# Patient Record
Sex: Male | Born: 1995 | Race: Black or African American | Hispanic: No | Marital: Single | Smoking: Current every day smoker
Health system: Southern US, Community
[De-identification: ages and names within clinical notes are randomized; demographics above are authoritative.]

## PROBLEM LIST (undated history)

## (undated) ENCOUNTER — Emergency Department (HOSPITAL_COMMUNITY): Discharge status: Absent without leave | Payer: Self-pay

## (undated) DIAGNOSIS — I10 Essential (primary) hypertension: Secondary | ICD-10-CM

## (undated) HISTORY — PX: WISDOM TOOTH EXTRACTION: SHX21

## (undated) HISTORY — PX: CERVICAL FUSION: SHX112

---

## 2015-11-24 ENCOUNTER — Emergency Department (HOSPITAL_COMMUNITY)
Admission: EM | Admit: 2015-11-24 | Discharge: 2015-11-24 | Disposition: A | Discharge status: Home / Self Care | Payer: Self-pay | Attending: Emergency Medicine | Admitting: Emergency Medicine

## 2015-11-24 ENCOUNTER — Emergency Department (HOSPITAL_COMMUNITY): Payer: Self-pay

## 2015-11-24 ENCOUNTER — Encounter (HOSPITAL_COMMUNITY): Payer: Self-pay | Admitting: Emergency Medicine

## 2015-11-24 DIAGNOSIS — X500XXA Overexertion from strenuous movement or load, initial encounter: Secondary | ICD-10-CM | POA: Insufficient documentation

## 2015-11-24 DIAGNOSIS — Y9389 Activity, other specified: Secondary | ICD-10-CM | POA: Insufficient documentation

## 2015-11-24 DIAGNOSIS — S46912A Strain of unspecified muscle, fascia and tendon at shoulder and upper arm level, left arm, initial encounter: Secondary | ICD-10-CM | POA: Insufficient documentation

## 2015-11-24 DIAGNOSIS — Y92009 Unspecified place in unspecified non-institutional (private) residence as the place of occurrence of the external cause: Secondary | ICD-10-CM | POA: Insufficient documentation

## 2015-11-24 DIAGNOSIS — Y999 Unspecified external cause status: Secondary | ICD-10-CM | POA: Insufficient documentation

## 2015-11-24 MED ORDER — NAPROXEN 500 MG PO TABS: 500.0000 mg | ORAL_TABLET | Freq: Two times a day (BID) | ORAL | 0 refills | Status: DC

## 2015-11-24 NOTE — ED Triage Notes (Signed)
Pt c/o left shoulder pain for a couple of days. Pt denies any injury.

## 2015-11-24 NOTE — ED Provider Notes (Signed)
[facility]-EMERGENCY DEPT Provider Note   CSN: [id number] Arrival date & time: 11/24/15  1913     History   Chief Complaint Chief Complaint  Patient presents with  . Shoulder Pain    HPI Tyler Solomon is a 20 y.o. male presenting with left anterior shoulder pain which has been persistent for past week and is worsened with certain movements, esp attempts to flex the shoulder beyond 90 degrees.  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  He endorses having an AC injury while in high school.  He denies weakness or numbness in his upper extremities and denies cp, sob, fevers, cough.  He took ibuprofen 400 mg x 1 this week without relief of pain.  The history is provided by the patient.    History reviewed. No pertinent past medical history.  There are no active problems to display for this patient.   Past Surgical History:  Procedure Laterality Date  . CERVICAL FUSION    . WISDOM TOOTH EXTRACTION         Home Medications    Prior to Admission medications   Medication Sig Start Date End Date Taking? Authorizing Provider  naproxen (NAPROSYN) 500 MG tablet Take 1 tablet (500 mg total) by mouth 2 (two) times daily. 11/24/15   Burgess AmorJulie Candance Bohlman, PA-C    Family History History reviewed. No pertinent family history.  Social History Social History  Substance Use Topics  . Smoking status: Never Smoker  . Smokeless tobacco: Never Used  . Alcohol use Yes     Allergies   Review of patient's allergies indicates no known allergies.   Review of Systems Review of Systems  Constitutional: Negative for fever.  Respiratory: Negative for shortness of breath.   Cardiovascular: Negative for chest pain.  Musculoskeletal: Positive for arthralgias and joint swelling. Negative for myalgias.  Neurological: Negative for weakness and numbness.      Physical Exam Updated Vital Signs BP 146/70 (BP Location: Left Arm)   Pulse 71   Temp 98.1 F (36.7 C) (Oral)   Resp 16   Ht 6' (1.829 m)   Wt 94.8 kg   SpO2 100%   BMI 28.35 kg/m   Physical Exam  Constitutional: He appears well-developed and well-nourished.  HENT:  Head: Atraumatic.  Neck: Normal range of motion.  Cardiovascular:  Pulses equal bilaterally  Musculoskeletal: He exhibits tenderness. He exhibits no edema or deformity.       Left shoulder: He exhibits tenderness. He exhibits no bony tenderness, no swelling, no effusion, no crepitus, no deformity, no spasm and normal strength.  ttp anterior deltoid (left) without palpable deformity or edema.    Neurological: He is alert. He has normal strength. He displays normal reflexes. No sensory deficit.  Skin: Skin is warm and dry.  Psychiatric: He has a normal mood and affect.     ED Treatments / Results  Labs (all labs ordered are listed, but only abnormal results are displayed) Labs Reviewed - No data to display  EKG  EKG Interpretation None       Radiology Dg Shoulder Left  Result Date: 11/24/2015 CLINICAL DATA:  Left shoulder pain after ovary use. History of AC injury. Pain for 1-1/2 weeks. EXAM: LEFT SHOULDER - 2+ VIEW COMPARISON:  None. FINDINGS: There is no evidence of fracture or dislocation. There is no evidence of  arthropathy or other focal bone abnormality. The acromioclavicular joint is congruent. Soft tissues are unremarkable. IMPRESSION: Negative radiographs of the left shoulder. Acromioclavicular joint is congruent. Electronically Signed   By: Rubye Oaks M.D.   On: 11/24/2015 21:10    Procedures Procedures (including critical care time)  Medications Ordered in ED Medications - No data to display   Initial Impression / Assessment and Plan / ED Course  I have reviewed the triage vital signs and the nursing notes.  Pertinent labs & imaging results that were available during my care  of the patient were reviewed by me and considered in my medical decision making (see chart for details).  Clinical Course    Imaging reviewed and negative.  Probable deltoid strain, discussed he could have a rotator injury, but will need f/u with ortho if sx persist after tx.  Referral given.  Ice,  Rest,  Naproxen.    Final Clinical Impressions(s) / ED Diagnoses   Final diagnoses:  Left shoulder strain, initial encounter    New Prescriptions New Prescriptions   NAPROXEN (NAPROSYN) 500 MG TABLET    Take 1 tablet (500 mg total) by mouth 2 (two) times daily.     Burgess Amor, PA-C 11/24/15 2128    Blane Ohara, MD 11/25/15 [phone]

## 2017-05-19 IMAGING — DX DG SHOULDER 2+V*L*
3 series · 3 of 3 positions shown · non-contrast
Comparison: None.

CLINICAL DATA: Left shoulder pain after ovary use. History of AC
injury. Pain for 1-1/2 weeks.

EXAM:
LEFT SHOULDER - 2+ VIEW

[shoulder grashey]
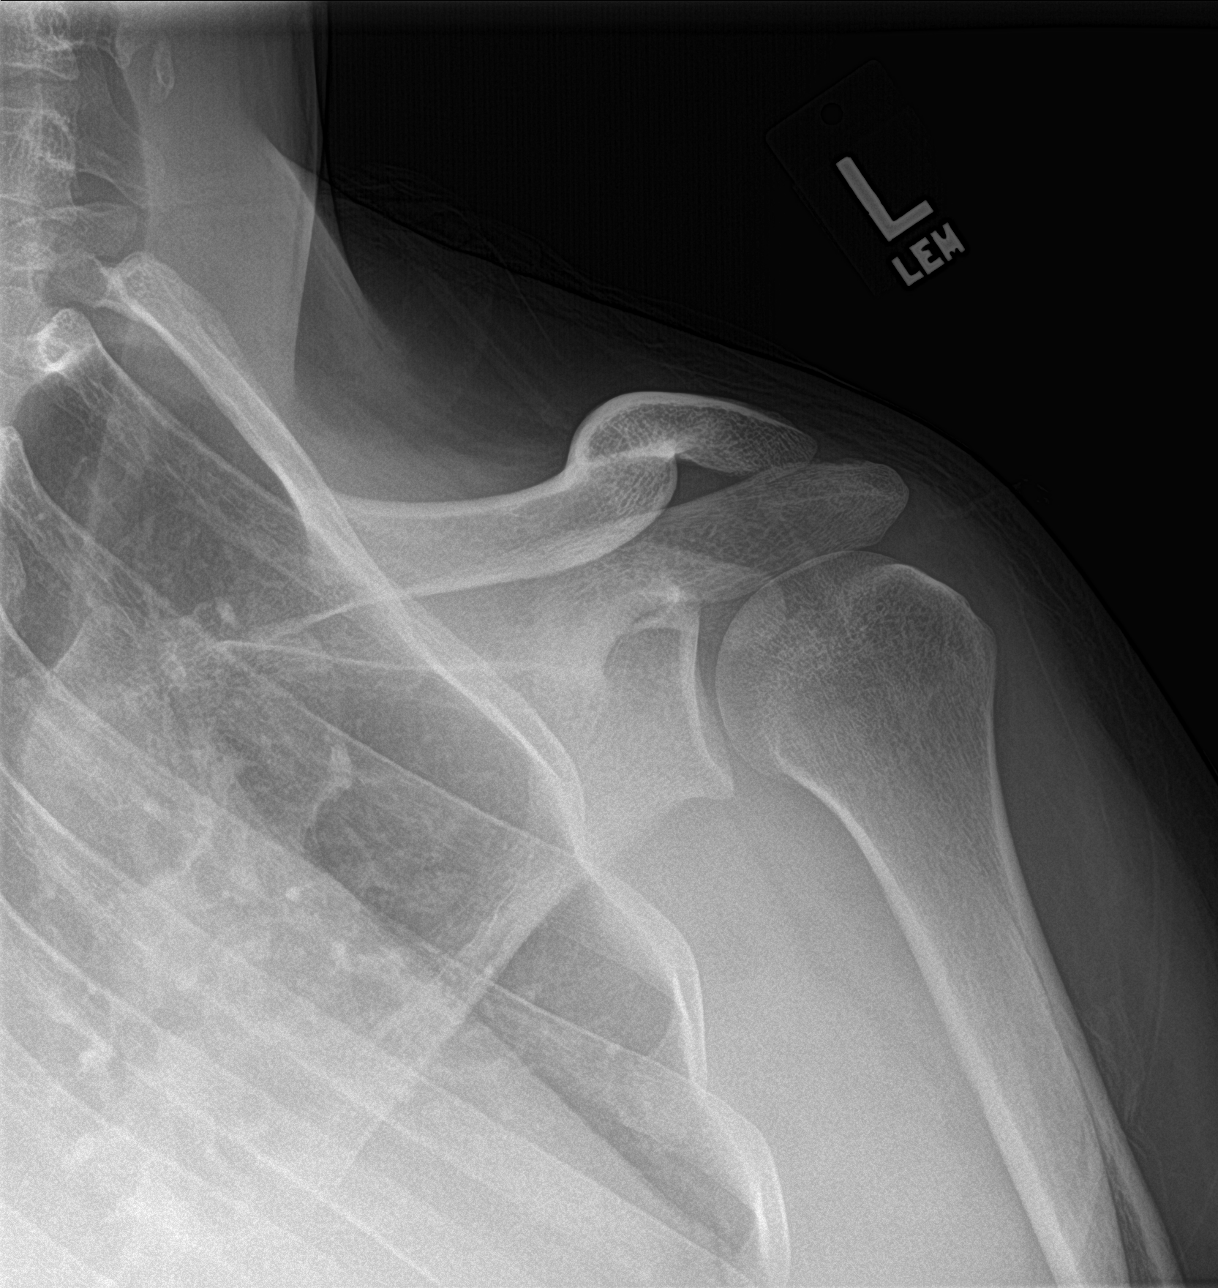

[shoulder y view]
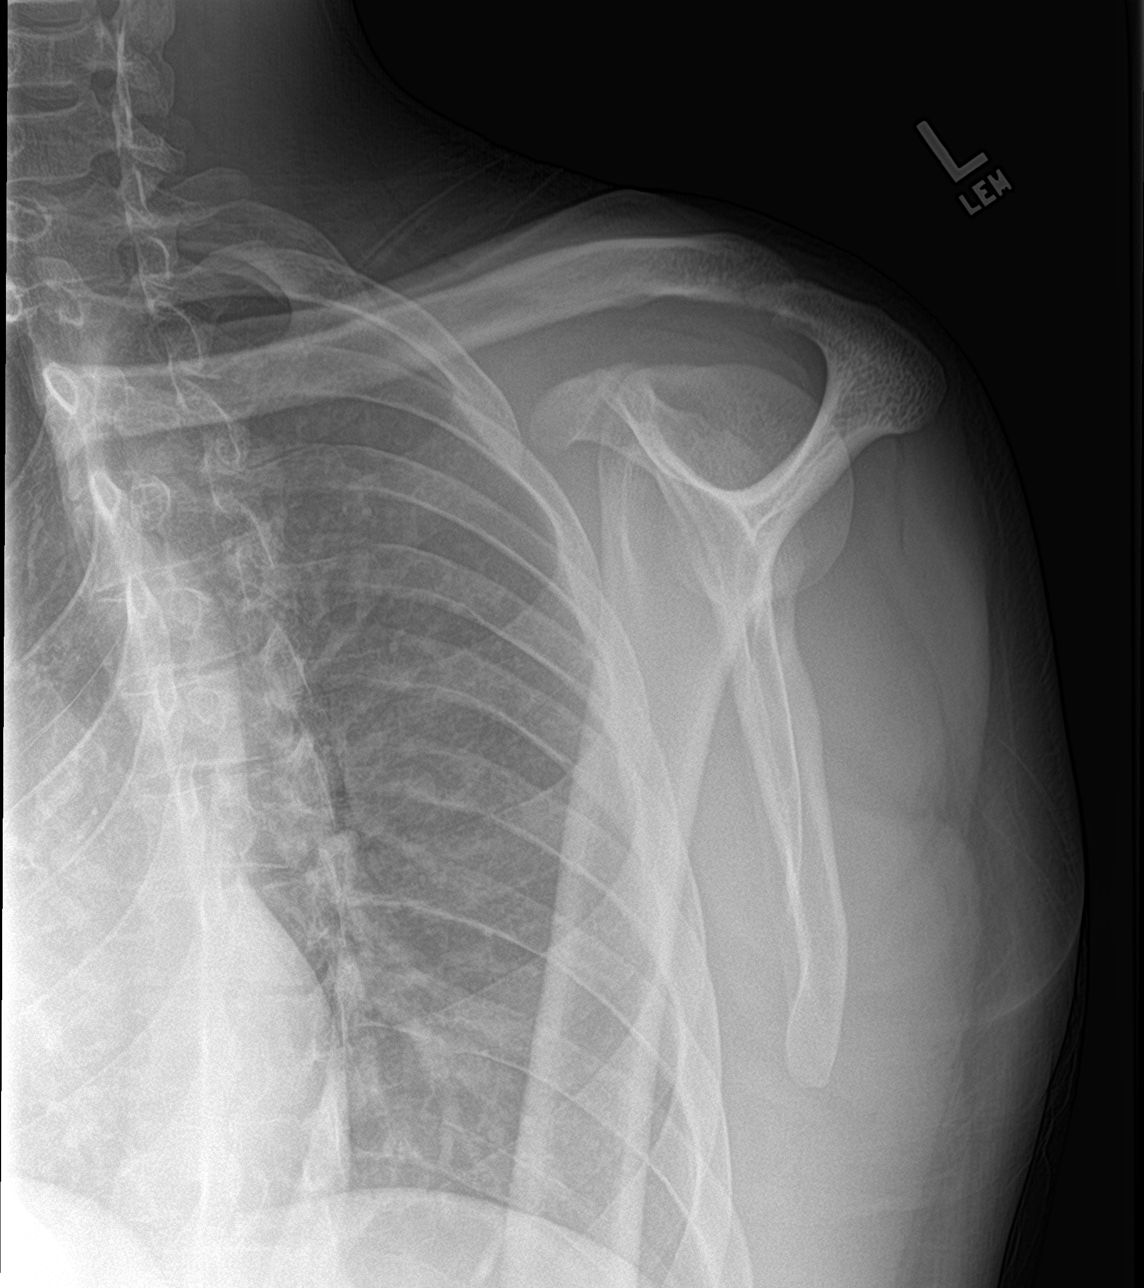

[shoulder axillary]
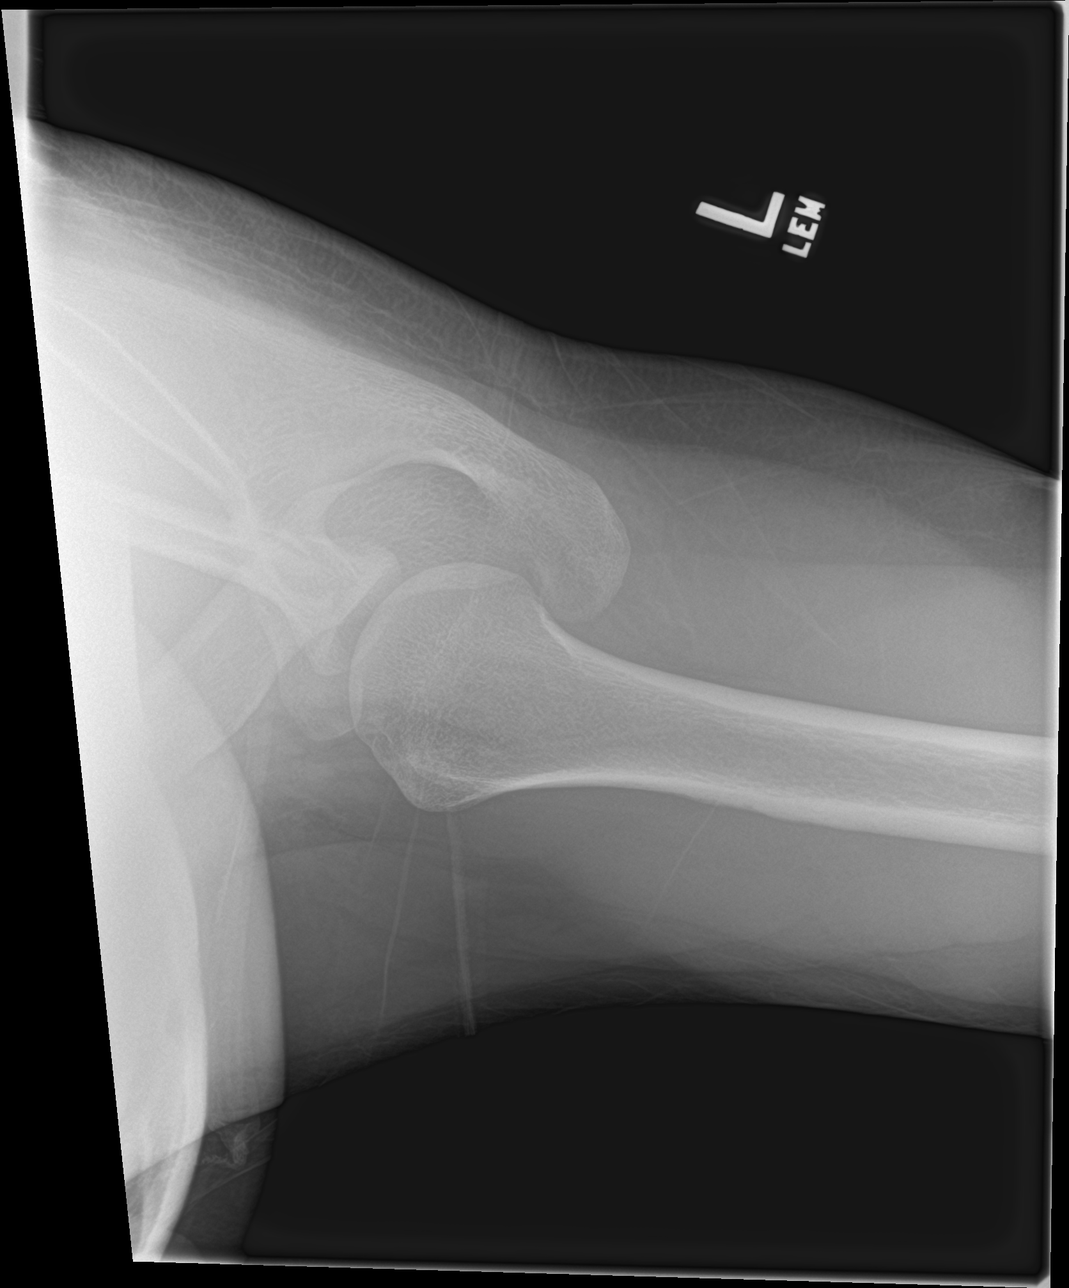

[3 of 3 positions shown; findings below may reference images not displayed]

FINDINGS: There is no evidence of fracture or dislocation. There is no
evidence of arthropathy or other focal bone abnormality. The
acromioclavicular joint is congruent. Soft tissues are unremarkable.
IMPRESSION: Negative radiographs of the left shoulder. Acromioclavicular joint
is congruent.

## 2017-09-18 ENCOUNTER — Other Ambulatory Visit: Payer: Self-pay

## 2017-09-18 ENCOUNTER — Emergency Department (HOSPITAL_COMMUNITY)
Admission: EM | Admit: 2017-09-18 | Discharge: 2017-09-19 | Disposition: A | Discharge status: Home / Self Care | Payer: 59 | Attending: Emergency Medicine | Admitting: Emergency Medicine

## 2017-09-18 ENCOUNTER — Encounter (HOSPITAL_COMMUNITY): Payer: Self-pay

## 2017-09-18 DIAGNOSIS — W268XXA Contact with other sharp object(s), not elsewhere classified, initial encounter: Secondary | ICD-10-CM | POA: Insufficient documentation

## 2017-09-18 DIAGNOSIS — Y93E5 Activity, floor mopping and cleaning: Secondary | ICD-10-CM | POA: Insufficient documentation

## 2017-09-18 DIAGNOSIS — Z79899 Other long term (current) drug therapy: Secondary | ICD-10-CM | POA: Insufficient documentation

## 2017-09-18 DIAGNOSIS — Y999 Unspecified external cause status: Secondary | ICD-10-CM | POA: Insufficient documentation

## 2017-09-18 DIAGNOSIS — S61411A Laceration without foreign body of right hand, initial encounter: Secondary | ICD-10-CM | POA: Insufficient documentation

## 2017-09-18 DIAGNOSIS — I10 Essential (primary) hypertension: Secondary | ICD-10-CM | POA: Diagnosis not present

## 2017-09-18 DIAGNOSIS — Y929 Unspecified place or not applicable: Secondary | ICD-10-CM | POA: Diagnosis not present

## 2017-09-18 HISTORY — DX: Essential (primary) hypertension: I10

## 2017-09-18 NOTE — ED Provider Notes (Signed)
[facility] EMERGENCY DEPARTMENT Provider Note   CSN: [id number] Arrival date & time: 09/18/17  2017     History   Chief Complaint Chief Complaint  Patient presents with  . Extremity Laceration    right palm    HPI Tyler Solomon is a 22 y.o. male presenting with laceration to the volar surface of his right hand which occurred around 9 AM this morning.  He was sweeping using a plastic handled broom when the handle snapped cutting his hand with a sharp edge.  He has cleaned the wound, applied multiple attempts at bandaging, but with movement and normal activity the wound continues to open and bleed.  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  He denies significant pain at the site.  He has no numbness or pain radiating into his wrist or his fingers.  His tetanus is current.  The history is provided by the patient.    Past Medical History:  Diagnosis Date  . Hypertension     There are no active problems to display for this patient.   Past Surgical History:  Procedure Laterality Date  . CERVICAL FUSION    . WISDOM TOOTH EXTRACTION          Home Medications    Prior to Admission medications   Medication Sig Start Date End Date Taking? Authorizing Provider  naproxen (NAPROSYN) 500 MG tablet Take 1 tablet (500 mg total) by mouth 2 (two) times daily. 11/24/15   Burgess Amor, PA-C    Family History No family history on file.  Social History Social History   Tobacco Use  . Smoking status: Never Smoker  . Smokeless tobacco: Never Used  Substance Use Topics  . Alcohol use: Yes  . Drug use: No     Allergies   Patient has no known allergies.   Review of Systems Review of Systems  Constitutional: Negative for chills and fever.  Respiratory: Negative.   Cardiovascular: Negative.   Gastrointestinal: Negative.   Musculoskeletal: Negative for arthralgias.  Skin: Positive for wound.  Neurological: Negative  for weakness and numbness.     Physical Exam Updated Vital Signs BP (!) 157/86   Pulse 77   Temp 98.1 F (36.7 C) (Oral)   Resp 18   Ht 6' (1.829 m)   Wt 94.3 kg (208 lb)   SpO2 99%   BMI 28.21 kg/m   Physical Exam  Constitutional: He is oriented to person, place, and time. He appears well-developed and well-nourished.  HENT:  Head: Normocephalic.  Cardiovascular: Normal rate.  Pulmonary/Chest: Effort normal.  Musculoskeletal: He exhibits no tenderness.  Neurological: He is alert and oriented to person, place, and time. No sensory deficit.  Skin: Laceration noted.  1 cm curvilinear superficial laceration which appears fairly approximated on his volar right hand.  Hemostatic.     ED Treatments / Results  Labs (all labs ordered are listed, but only abnormal results are displayed) Labs Reviewed - No data to display  EKG None  Radiology No results found.  Procedures Procedures (including critical care time)  LACERATION REPAIR Performed by: Burgess Amor Authorized by: Burgess Amor Consent: Verbal consent obtained. Risks and benefits: risks, benefits and alternatives were discussed Consent given by: patient Patient identity confirmed: provided demographic data Prepped and Draped in normal sterile fashion Wound explored  Laceration Location: right hand  Laceration Length: 1cm  No Foreign Bodies seen or palpated  Anesthesia: None  Local  anesthetic: N/A  Anesthetic total: NA Irrigation method: Safe cleanse followed by scrubbing with 4 x 4's Amount of cleaning: Wound was aggressively cleaned.  Skin closure: Dermabond  Number of sutures: N/A  Technique: Dermabond  Patient tolerance: Patient tolerated the procedure well with no immediate complications.   Medications Ordered in ED Medications - No data to display   Initial Impression / Assessment and Plan / ED Course  I have reviewed the triage vital signs and the nursing notes.  Pertinent labs &  imaging results that were available during my care of the patient were reviewed by me and considered in my medical decision making (see chart for details).     Discussed the risks and benefits of Dermabond including possible risk of infection given his wound is older than 6 hours.  Patient insists that with his job he must have closure that completely seals the wound.  I advised he get rechecked immediately for any increased redness, pain, swelling or drainage of pus.  PRN follow-up anticipated.  Final Clinical Impressions(s) / ED Diagnoses   Final diagnoses:  Laceration of right hand without foreign body, initial encounter    ED Discharge Orders    None       Victoriano Laindol, Jadden Yim, PA-C 09/19/17 0043    Bethann BerkshireZammit, Joseph, MD 09/20/17 [phone]

## 2017-09-18 NOTE — ED Triage Notes (Signed)
Pt reports cutting hand on broom handle last night. Skin flap has already started to reconnect. No bleeding, superficial.

## 2017-09-18 NOTE — Discharge Instructions (Signed)
As discussed,  get rechecked for any signs of infection including redness, swelling, increased pain or drainage of pus.

## 2019-03-08 ENCOUNTER — Other Ambulatory Visit: Payer: Self-pay

## 2019-03-08 ENCOUNTER — Ambulatory Visit
Admission: EM | Admit: 2019-03-08 | Discharge: 2019-03-08 | Disposition: A | Discharge status: Home / Self Care | Payer: 59 | Attending: Emergency Medicine | Admitting: Emergency Medicine

## 2019-03-08 NOTE — ED Triage Notes (Signed)
Pt presents to UC stating he needs a covid test for work. Pt denies any exposures or symptoms.

## 2019-03-09 LAB — NOVEL CORONAVIRUS, NAA: SARS-CoV-2, NAA: NOT DETECTED

## 2019-04-15 ENCOUNTER — Other Ambulatory Visit: Payer: Self-pay

## 2019-04-15 ENCOUNTER — Ambulatory Visit
Admission: EM | Admit: 2019-04-15 | Discharge: 2019-04-15 | Disposition: A | Discharge status: Home / Self Care | Payer: 59 | Attending: Emergency Medicine | Admitting: Emergency Medicine

## 2019-04-15 DIAGNOSIS — Z20822 Contact with and (suspected) exposure to covid-19: Secondary | ICD-10-CM

## 2019-04-15 NOTE — ED Triage Notes (Signed)
Pt presents to UC for covid test for employer. Pt denies symptoms. Pt had secondary exposure.

## 2019-04-15 NOTE — ED Provider Notes (Signed)
[place]   [id number] 04/15/19 Arrival Time: 1826   CC: COVID test  SUBJECTIVE: History from: patient.  Tyler Solomon is a 24 y.o. male who presents for COVID testing.  Complains of secondary exposure.  Mother had positive exposure.  Mother tested negative for COVID.  Denies recent travel.  Denies aggravating or alleviating symptoms.  Denies previous COVID infection.   Denies fever, chills, fatigue, nasal congestion, rhinorrhea, sore throat, cough, SOB, wheezing, chest pain, nausea, vomiting, changes in bowel or bladder habits.    ROS: As per HPI.  All other pertinent ROS negative.     Past Medical History:  Diagnosis Date  . Hypertension    Past Surgical History:  Procedure Laterality Date  . CERVICAL FUSION    . WISDOM TOOTH EXTRACTION     No Known Allergies No current facility-administered medications on file prior to encounter.   Current Outpatient Medications on File Prior to Encounter  Medication Sig Dispense Refill  . naproxen (NAPROSYN) 500 MG tablet Take 1 tablet (500 mg total) by mouth 2 (two) times daily. 30 tablet 0   Social History   Socioeconomic History  . Marital status: Single    Spouse name: Not on file  . Number of children: Not on file  . Years of education: Not on file  . Highest education level: Not on file  Occupational History  . Not on file  Tobacco Use  . Smoking status: Never Smoker  . Smokeless tobacco: Never Used  Substance and Sexual Activity  . Alcohol use: Yes  . Drug use: No  . Sexual activity: Not on file  Other Topics Concern  . Not on file  Social History Narrative  . Not on file   Social Determinants of Health   Financial Resource Strain:   . Difficulty of Paying Living Expenses: Not on file  Food Insecurity:   . Worried About Charity fundraiser in the Last Year: Not on file  . Ran Out of Food in the Last Year: Not on file  Transportation Needs:   . Lack of Transportation (Medical): Not on file  . Lack  of Transportation (Non-Medical): Not on file  Physical Activity:   . Days of Exercise per Week: Not on file  . Minutes of Exercise per Session: Not on file  Stress:   . Feeling of Stress : Not on file  Social Connections:   . Frequency of Communication with Friends and Family: Not on file  . Frequency of Social Gatherings with Friends and Family: Not on file  . Attends Religious Services: Not on file  . Active Member of Clubs or Organizations: Not on file  . Attends Archivist Meetings: Not on file  . Marital Status: Not on file  Intimate Partner Violence:   . Fear of Current or Ex-Partner: Not on file  . Emotionally Abused: Not on file  . Physically Abused: Not on file  . Sexually Abused: Not on file   Family History  Problem Relation Age of Onset  . Healthy Mother   . Healthy Father     OBJECTIVE:  Vitals:   04/15/19 1839  BP: 121/80  Pulse: 81  Resp: 16  Temp: 98.5 F (36.9 C)  TempSrc: Oral  SpO2: 97%     General appearance: alert; well-appearing, nontoxic; speaking in full sentences and tolerating own secretions HEENT: NCAT; Ears: EACs clear, TMs pearly gray; Eyes: PERRL.  EOM grossly intact.Nose: nares patent without rhinorrhea, Throat: oropharynx clear, tonsils  non erythematous or enlarged, uvula midline  Neck: supple without LAD Lungs: unlabored respirations, symmetrical air entry; cough: absent; no respiratory distress; CTAB Heart: regular rate and rhythm.  Skin: warm and dry Psychological: alert and cooperative; normal mood and affect  ASSESSMENT & PLAN:  1. Suspected COVID-19 virus infection   2. Encounter for laboratory testing for COVID-19 virus    COVID testing ordered.  It will take between 5-7 days for test results.  Someone will contact you regarding abnormal results.    In the meantime: You should remain isolated in your home for 10 days from symptom onset AND greater than 72 hours after symptoms resolution (absence of fever without  the use of fever-reducing medication and improvement in respiratory symptoms), whichever is longer OR 14 days from exposure Get plenty of rest and push fluids Use OTC zyrtec for nasal congestion, runny nose, and/or sore throat Use OTC flonase for nasal congestion and runny nose Use medications daily for symptom relief Use OTC medications like ibuprofen or tylenol as needed fever or pain Call or go to the ED if you have any new or worsening symptoms such as fever, cough, shortness of breath, chest tightness, chest pain, turning blue, changes in mental status, etc...   Reviewed expectations re: course of current medical issues. Questions answered. Outlined signs and symptoms indicating need for more acute intervention. Patient verbalized understanding. After Visit Summary given.         Rennis Harding, PA-C 04/15/19 1851

## 2019-04-15 NOTE — Discharge Instructions (Addendum)

## 2019-04-17 LAB — NOVEL CORONAVIRUS, NAA: SARS-CoV-2, NAA: NOT DETECTED

## 2019-06-23 ENCOUNTER — Other Ambulatory Visit: Payer: Self-pay

## 2019-06-23 ENCOUNTER — Encounter: Payer: Self-pay | Admitting: Emergency Medicine

## 2019-06-23 ENCOUNTER — Ambulatory Visit
Admission: EM | Admit: 2019-06-23 | Discharge: 2019-06-23 | Disposition: A | Discharge status: Home / Self Care | Payer: 59 | Attending: Emergency Medicine | Admitting: Emergency Medicine

## 2019-06-23 DIAGNOSIS — Z0189 Encounter for other specified special examinations: Secondary | ICD-10-CM

## 2019-06-23 DIAGNOSIS — Z20822 Contact with and (suspected) exposure to covid-19: Secondary | ICD-10-CM

## 2019-06-23 NOTE — ED Provider Notes (Signed)
[place]   [id number] 06/23/19 Arrival Time: 1824   CC: COVID exposure  SUBJECTIVE: History from: patient.  Tyler Solomon is a 24 y.o. male who presents for COVID testing.  Complains of sick exposure 3 days ago to coworker.  Denies aggravating or alleviating symptoms.  Denies previous COVID infection.   Denies fever, chills, fatigue, nasal congestion, rhinorrhea, sore throat, cough, SOB, wheezing, chest pain, nausea, vomiting, changes in bowel or bladder habits.    ROS: As per HPI.  All other pertinent ROS negative.     Past Medical History:  Diagnosis Date  . Hypertension    Past Surgical History:  Procedure Laterality Date  . CERVICAL FUSION    . WISDOM TOOTH EXTRACTION     No Known Allergies No current facility-administered medications on file prior to encounter.   No current outpatient medications on file prior to encounter.   Social History   Socioeconomic History  . Marital status: Single    Spouse name: Not on file  . Number of children: Not on file  . Years of education: Not on file  . Highest education level: Not on file  Occupational History  . Not on file  Tobacco Use  . Smoking status: Never Smoker  . Smokeless tobacco: Never Used  Substance and Sexual Activity  . Alcohol use: Yes  . Drug use: No  . Sexual activity: Not on file  Other Topics Concern  . Not on file  Social History Narrative  . Not on file   Social Determinants of Health   Financial Resource Strain:   . Difficulty of Paying Living Expenses:   Food Insecurity:   . Worried About Charity fundraiser in the Last Year:   . Arboriculturist in the Last Year:   Transportation Needs:   . Film/video editor (Medical):   Marland Kitchen Lack of Transportation (Non-Medical):   Physical Activity:   . Days of Exercise per Week:   . Minutes of Exercise per Session:   Stress:   . Feeling of Stress :   Social Connections:   . Frequency of Communication with Friends and Family:   .  Frequency of Social Gatherings with Friends and Family:   . Attends Religious Services:   . Active Member of Clubs or Organizations:   . Attends Archivist Meetings:   Marland Kitchen Marital Status:   Intimate Partner Violence:   . Fear of Current or Ex-Partner:   . Emotionally Abused:   Marland Kitchen Physically Abused:   . Sexually Abused:    Family History  Problem Relation Age of Onset  . Healthy Mother   . Healthy Father     OBJECTIVE:  Vitals:   06/23/19 1831  BP: (!) 147/85  Pulse: 85  Resp: 16  Temp: 99 F (37.2 C)  TempSrc: Oral  SpO2: 98%    General appearance: alert; well-appearing, nontoxic; speaking in full sentences and tolerating own secretions HEENT: NCAT; Ears: EACs clear; Eyes: PERRL.  EOM grossly intact.Nose: nares patent without rhinorrhea, Throat: oropharynx clear, tonsils non erythematous or enlarged, uvula midline  Neck: supple without LAD Lungs: unlabored respirations, symmetrical air entry; cough: absent; no respiratory distress; CTAB Heart: regular rate and rhythm.  Skin: warm and dry Psychological: alert and cooperative; normal mood and affect  ASSESSMENT & PLAN:  1. Exposure to COVID-19 virus   2. Patient request for diagnostic testing     COVID testing ordered.  It will take between 2-5 days for test  results.  Someone will contact you regarding abnormal results.    In the meantime:  If you were to develop symptoms: You should remain isolated in your home for 10 days from symptom onset AND greater than 72 hours after symptoms resolution (absence of fever without the use of fever-reducing medication and improvement in respiratory symptoms), whichever is longer If you have had exposure: you should remain in quarantine for 7 days from exposure.  However, if symptoms develop you must self isolate and return for retesting Get plenty of rest and push fluids Follow up with PCP as needed Call or go to the ED if you have any new symptoms such as fever, cough,  shortness of breath, chest tightness, chest pain, turning blue, changes in mental status, etc...   Reviewed expectations re: course of current medical issues. Questions answered. Outlined signs and symptoms indicating need for more acute intervention. Patient verbalized understanding. After Visit Summary given.         Rennis Harding, PA-C 06/23/19 1837

## 2019-06-23 NOTE — Discharge Instructions (Signed)

## 2019-06-23 NOTE — ED Triage Notes (Signed)
Pt presents with complaints of needing covid test after exposure on Sunday. Denies any symptoms. Intake completed by [place] PA.

## 2019-06-24 LAB — NOVEL CORONAVIRUS, NAA: SARS-CoV-2, NAA: NOT DETECTED

## 2019-06-24 LAB — SARS-COV-2, NAA 2 DAY TAT

## 2019-11-21 ENCOUNTER — Ambulatory Visit
Admission: EM | Admit: 2019-11-21 | Discharge: 2019-11-21 | Disposition: A | Discharge status: Home / Self Care | Payer: 59 | Attending: Family Medicine | Admitting: Family Medicine

## 2019-11-21 ENCOUNTER — Other Ambulatory Visit: Payer: Self-pay

## 2019-11-21 DIAGNOSIS — Z1152 Encounter for screening for COVID-19: Secondary | ICD-10-CM

## 2019-11-21 NOTE — ED Triage Notes (Signed)
Pt here for covid test , no symptoms  

## 2019-11-22 LAB — SARS-COV-2, NAA 2 DAY TAT

## 2019-11-22 LAB — NOVEL CORONAVIRUS, NAA: SARS-CoV-2, NAA: NOT DETECTED

## 2020-01-06 ENCOUNTER — Ambulatory Visit
Admission: RE | Admit: 2020-01-06 | Discharge: 2020-01-06 | Disposition: A | Discharge status: Home / Self Care | Payer: 59 | Source: Ambulatory Visit | Attending: Emergency Medicine | Admitting: Emergency Medicine

## 2020-01-06 ENCOUNTER — Other Ambulatory Visit: Payer: Self-pay

## 2020-01-06 NOTE — ED Triage Notes (Signed)
Pt presents for covid test. Denies symptoms.

## 2020-01-08 LAB — SARS-COV-2, NAA 2 DAY TAT

## 2020-01-08 LAB — NOVEL CORONAVIRUS, NAA: SARS-CoV-2, NAA: NOT DETECTED

## 2020-03-28 ENCOUNTER — Other Ambulatory Visit: Payer: 59

## 2020-04-19 ENCOUNTER — Encounter (HOSPITAL_COMMUNITY): Payer: Self-pay

## 2020-04-19 ENCOUNTER — Other Ambulatory Visit: Payer: Self-pay

## 2020-04-19 ENCOUNTER — Emergency Department (HOSPITAL_COMMUNITY)
Admission: EM | Admit: 2020-04-19 | Discharge: 2020-04-19 | Disposition: A | Discharge status: Home / Self Care | Payer: BC Managed Care – PPO | Attending: Emergency Medicine | Admitting: Emergency Medicine

## 2020-04-19 DIAGNOSIS — W57XXXA Bitten or stung by nonvenomous insect and other nonvenomous arthropods, initial encounter: Secondary | ICD-10-CM | POA: Insufficient documentation

## 2020-04-19 DIAGNOSIS — I10 Essential (primary) hypertension: Secondary | ICD-10-CM | POA: Insufficient documentation

## 2020-04-19 DIAGNOSIS — S60561A Insect bite (nonvenomous) of right hand, initial encounter: Secondary | ICD-10-CM | POA: Diagnosis present

## 2020-04-19 DIAGNOSIS — S0086XA Insect bite (nonvenomous) of other part of head, initial encounter: Secondary | ICD-10-CM | POA: Insufficient documentation

## 2020-04-19 DIAGNOSIS — F17290 Nicotine dependence, other tobacco product, uncomplicated: Secondary | ICD-10-CM | POA: Insufficient documentation

## 2020-04-19 DIAGNOSIS — S30860A Insect bite (nonvenomous) of lower back and pelvis, initial encounter: Secondary | ICD-10-CM | POA: Diagnosis not present

## 2020-04-19 NOTE — Discharge Instructions (Addendum)
Please keep the area clean and dry.  You may use topical anti-itch cream or antibiotic cream to the areas as well as Benadryl if needed.  Please make sure to return to the ER if you develop fevers, have increasing warmth, redness, inability to move your joints, throat closing, shortness of breath.

## 2020-04-19 NOTE — ED Triage Notes (Signed)
Pt reports he believes he was bite by a spider on his right hand approx 1 week ago. The area was itching, now he has a red area to right forehead and lower back

## 2020-04-19 NOTE — ED Provider Notes (Signed)
[facility] EMERGENCY DEPARTMENT Provider Note   CSN: [id number] Arrival date & time: 04/19/20  3154     History Chief Complaint  Patient presents with  . Insect Bite    Tyler Solomon is a 25 y.o. male.  HPI 25 year old male with history of hypertension presents to the ER with complaints of multiple insect bites.  States he noticed a bite to the right dorsal lateral side of his right hand approximately a week ago.  States it began to itch several days ago.  He has been taking Benadryl as of this morning.  He also noticed a bug bite to his right forehead and left low back.  No known fevers or chills.  No drainage, warmth, swelling.  No throat closing, shortness of breath, rashes developing.  He was concerned that he may need antibiotics.    Past Medical History:  Diagnosis Date  . Hypertension     There are no problems to display for this patient.   Past Surgical History:  Procedure Laterality Date  . CERVICAL FUSION    . WISDOM TOOTH EXTRACTION         Family History  Problem Relation Age of Onset  . Healthy Mother   . Healthy Father     Social History   Tobacco Use  . Smoking status: Current Every Day Smoker    Types: Cigars  . Smokeless tobacco: Never Used  Substance Use Topics  . Alcohol use: Yes  . Drug use: No    Home Medications Prior to Admission medications   Not on File    Allergies    Patient has no known allergies.  Review of Systems   Review of Systems  Constitutional: Negative for fever.  Skin: Positive for color change and wound.    Physical Exam Updated Vital Signs BP (!) 164/93 (BP Location: Right Arm)   Pulse 66   Temp 98.1 F (36.7 C) (Oral)   Resp 20   Ht 6' (1.829 m)   Wt 106.6 kg   SpO2 100%   BMI 31.87 kg/m   Physical Exam Vitals and nursing note reviewed.  Constitutional:      General: He is not in acute distress.    Appearance: He is well-developed and well-nourished. He is obese. He is not ill-appearing or  diaphoretic.  HENT:     Head: Normocephalic and atraumatic.  Eyes:     Conjunctiva/sclera: Conjunctivae normal.  Cardiovascular:     Rate and Rhythm: Normal rate and regular rhythm.     Heart sounds: No murmur heard.   Pulmonary:     Effort: Pulmonary effort is normal. No respiratory distress.     Breath sounds: Normal breath sounds.  Abdominal:     Palpations: Abdomen is soft.     Tenderness: There is no abdominal tenderness.  Musculoskeletal:        General: No edema. Normal range of motion.     Cervical back: Neck supple.  Skin:    General: Skin is warm and dry.     Findings: Lesion present. No erythema.     Comments: Evidence of a dime size indurated area to the dorsal aspect of the lateral right hand.  No warmth, drainage, erythema.  Also a small superficial insect bite with no central fluctuance, warmth erythema to the right lateral forehead as well as a similar appearing wound to his left lower back.  Neurological:     Mental Status: He is alert.  Psychiatric:  Mood and Affect: Mood and affect normal.     ED Results / Procedures / Treatments   Labs (all labs ordered are listed, but only abnormal results are displayed) Labs Reviewed - No data to display  EKG None  Radiology No results found.  Procedures Procedures (including critical care time)  Medications Ordered in ED Medications - No data to display  ED Course  I have reviewed the triage vital signs and the nursing notes.  Pertinent labs & imaging results that were available during my care of the patient were reviewed by me and considered in my medical decision making (see chart for details).    MDM Rules/Calculators/A&P                          25 year old male with multiple insect bites.  No evidence of infection, no warmth, swelling, drainage, erythema.  No known fevers or chills, afebrile here in the ED.  Patient denies any signs of anaphylaxis including throat swelling, shortness of breath,  rashes.  Low suspicion for allergic reaction.  I explained to the patient that no antibiotic treatments are indicated at this time.  Encouraged continuing Benadryl for itching, topical itch and possible antibiotic cream if he is concerned about infection.  Courage to keep the area clean and dry.  Discussed return precautions eluding redness, swelling, warmth, drainage, fevers, chills, throat closing, shortness of breath.  He was understanding and is agreeable.  At this stage in the ED course, the patient is medically screened and stable for discharge.   Final Clinical Impression(s) / ED Diagnoses Final diagnoses:  Insect bite, unspecified site, initial encounter    Rx / DC Orders ED Discharge Orders    None       Leone Brand 04/19/20 1032    Cheryll Cockayne, MD 04/19/20 1539

## 2020-09-18 ENCOUNTER — Ambulatory Visit: Payer: BC Managed Care – PPO

## 2020-10-06 ENCOUNTER — Ambulatory Visit
Admission: RE | Admit: 2020-10-06 | Discharge: 2020-10-06 | Disposition: A | Discharge status: Home / Self Care | Payer: BC Managed Care – PPO | Source: Ambulatory Visit | Attending: Emergency Medicine | Admitting: Emergency Medicine

## 2020-10-06 ENCOUNTER — Other Ambulatory Visit: Payer: Self-pay

## 2020-10-06 VITALS — BP 130/79 | HR 64 | Temp 97.9°F | Resp 20

## 2020-10-06 DIAGNOSIS — S46912A Strain of unspecified muscle, fascia and tendon at shoulder and upper arm level, left arm, initial encounter: Secondary | ICD-10-CM

## 2020-10-06 DIAGNOSIS — M25512 Pain in left shoulder: Secondary | ICD-10-CM

## 2020-10-06 MED ORDER — PREDNISONE 10 MG (21) PO TBPK: ORAL_TABLET | Freq: Every day | ORAL | 0 refills | Status: DC

## 2020-10-06 MED ORDER — CYCLOBENZAPRINE HCL 10 MG PO TABS: 10.0000 mg | ORAL_TABLET | Freq: Two times a day (BID) | ORAL | 0 refills | Status: DC | PRN

## 2020-10-06 MED ORDER — DEXAMETHASONE SODIUM PHOSPHATE 10 MG/ML IJ SOLN
10.0000 mg | Freq: Once | INTRAMUSCULAR | Status: AC
  Administered 2020-10-06: 10 mg via INTRAMUSCULAR

## 2020-10-06 NOTE — ED Provider Notes (Signed)
[facility] CARE CENTER   [id number] 10/06/20 Arrival Time: 1417  CC: Left Shoulder PAIN  SUBJECTIVE: History from: patient. Tyler Solomon is a 25 y.o. male complains of LT shoulder pain x 1 month.  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  Pain is diffuse about the shoulder.  Describes the pain as intermittent and burning in character.  Has tried OTC medications without relief.  Symptoms are made worse with repetitive movement.  Complains of some instability and clicking.  Denies similar symptoms in the past.  Denies fever, chills, erythema, ecchymosis, effusion, weakness, numbness and tingling.  ROS: As per HPI.  All other pertinent ROS negative.     Past Medical History:  Diagnosis Date   Hypertension    Past Surgical History:  Procedure Laterality Date   CERVICAL FUSION     WISDOM TOOTH EXTRACTION     No Known Allergies No current facility-administered medications on file prior to encounter.   No current outpatient medications on file prior to encounter.   Social History   Socioeconomic History   Marital status: Single    Spouse name: Not on file   Number of children: Not on file   Years of education: Not on file   Highest education level: Not on file  Occupational History   Not on file  Tobacco Use   Smoking status: Every Day    Pack years: 0.00    Types: Cigars   Smokeless tobacco: Never  Substance and Sexual Activity   Alcohol use: Yes   Drug use: No   Sexual activity: Not on file  Other Topics Concern   Not on file  Social History Narrative   Not on file   Social Determinants of Health   Financial Resource Strain: Not on file  Food Insecurity: Not on file  Transportation Needs: Not on file  Physical Activity: Not on file  Stress: Not on file  Social Connections: Not on file  Intimate Partner Violence: Not on file   Family History  Problem Relation Age of Onset   Healthy Mother    Healthy Father      OBJECTIVE:  Vitals:   10/06/20 1512  BP: 130/79  Pulse: 64  Resp: 20  Temp: 97.9 F (36.6 C)  SpO2: 95%    General appearance: ALERT; in no acute distress.  Head: NCAT Lungs: Normal respiratory effort Musculoskeletal: LT shoulder Inspection: Skin warm, dry, clear and intact without obvious erythema, effusion, or ecchymosis.  Palpation: diffusely TTP ROM: FROM active and passive Strength: 4+/5 shld abduction, 4+/5 shld adduction, 5/5 elbow flexion, 5/5 elbow extension, 5/5 grip strength Skin: warm and dry Neurologic: Ambulates without difficulty; Sensation intact about the upper/ lower extremities Psychological: alert and cooperative; normal mood and affect  ASSESSMENT & PLAN:  1. Acute pain of left shoulder   2. Strain of left shoulder, initial encounter     Meds ordered this encounter  Medications   predniSONE (STERAPRED UNI-PAK 21 TAB) 10 MG (21) TBPK tablet    Sig: Take by mouth daily. Take 6 tabs by mouth daily  for 2 days, then 5 tabs for 2 days, then 4 tabs for 2 days, then 3 tabs for 2 days, 2 tabs for 2 days, then 1 tab by mouth daily for 2 days    Dispense:  42 tablet    Refill:  0    Order Specific Question:   Supervising Provider    Answer:   Eustace Moore [[id number]]  cyclobenzaprine (FLEXERIL) 10 MG tablet    Sig: Take 1 tablet (10 mg total) by mouth 2 (two) times daily as needed for muscle spasms.    Dispense:  20 tablet    Refill:  0    Order Specific Question:   Supervising Provider    Answer:   Eustace Moore [[id number]]   dexamethasone (DECADRON) injection 10 mg    Continue conservative management of rest, ice, and gentle stretches Prednisone prescribed.  Take as directed and to completion Take cyclobenzaprine at nighttime for symptomatic relief. Avoid driving or operating heavy machinery while using medication. Follow up with PCP if symptoms persist Return or go to the ER if you have any new or worsening symptoms (fever, chills, chest  pain, redness, swelling, deformity, etc...)   Reviewed expectations re: course of current medical issues. Questions answered. Outlined signs and symptoms indicating need for more acute intervention. Patient verbalized understanding. After Visit Summary given.     Rennis Harding, PA-C 10/06/20 1552

## 2020-10-06 NOTE — ED Triage Notes (Signed)
Pt presents with left shoulder pain from lifting weights last month that has been getting worse

## 2020-10-06 NOTE — Discharge Instructions (Addendum)
Continue conservative management of rest, ice, and gentle stretches Prednisone prescribed.  Take as directed and to completion Take cyclobenzaprine at nighttime for symptomatic relief. Avoid driving or operating heavy machinery while using medication. Follow up with PCP if symptoms persist Return or go to the ER if you have any new or worsening symptoms (fever, chills, chest pain, redness, swelling, deformity, etc...)

## 2020-10-13 ENCOUNTER — Encounter (HOSPITAL_COMMUNITY): Payer: Self-pay | Admitting: Emergency Medicine

## 2020-10-13 ENCOUNTER — Emergency Department (HOSPITAL_COMMUNITY)
Admission: EM | Admit: 2020-10-13 | Discharge: 2020-10-13 | Disposition: A | Discharge status: Home / Self Care | Payer: BC Managed Care – PPO | Attending: Emergency Medicine | Admitting: Emergency Medicine

## 2020-10-13 DIAGNOSIS — S61022A Laceration with foreign body of left thumb without damage to nail, initial encounter: Secondary | ICD-10-CM | POA: Insufficient documentation

## 2020-10-13 DIAGNOSIS — I10 Essential (primary) hypertension: Secondary | ICD-10-CM | POA: Diagnosis not present

## 2020-10-13 DIAGNOSIS — F17290 Nicotine dependence, other tobacco product, uncomplicated: Secondary | ICD-10-CM | POA: Diagnosis not present

## 2020-10-13 DIAGNOSIS — S6992XA Unspecified injury of left wrist, hand and finger(s), initial encounter: Secondary | ICD-10-CM | POA: Diagnosis present

## 2020-10-13 DIAGNOSIS — S61012A Laceration without foreign body of left thumb without damage to nail, initial encounter: Secondary | ICD-10-CM

## 2020-10-13 DIAGNOSIS — W268XXA Contact with other sharp object(s), not elsewhere classified, initial encounter: Secondary | ICD-10-CM | POA: Insufficient documentation

## 2020-10-13 NOTE — ED Triage Notes (Signed)
Laceration to LT thumb while cutting open some chicken today

## 2020-10-13 NOTE — ED Provider Notes (Addendum)
[facility] EMERGENCY DEPARTMENT Provider Note   CSN: [id number] Arrival date & time: 10/13/20  1848     History Chief Complaint  Patient presents with   Laceration    Tyler Solomon is a 25 y.o. male.  HPI  25 year old male with a history of hypertension who presents to the emergency department today for evaluation of laceration of the left thumb that occurred to arrival while he was cutting some meat.  Normal sensation and range of motion to the thumb.  Tdap is up-to-date.  Has some pain that is worse with palpation and movement.  Past Medical History:  Diagnosis Date   Hypertension     There are no problems to display for this patient.   Past Surgical History:  Procedure Laterality Date   CERVICAL FUSION     WISDOM TOOTH EXTRACTION         Family History  Problem Relation Age of Onset   Healthy Mother    Healthy Father     Social History   Tobacco Use   Smoking status: Every Day    Types: Cigars   Smokeless tobacco: Never  Substance Use Topics   Alcohol use: Yes   Drug use: No    Home Medications Prior to Admission medications   Medication Sig Start Date End Date Taking? Authorizing Provider  cyclobenzaprine (FLEXERIL) 10 MG tablet Take 1 tablet (10 mg total) by mouth 2 (two) times daily as needed for muscle spasms. 10/06/20   Wurst, [place], PA-C  predniSONE (STERAPRED UNI-PAK 21 TAB) 10 MG (21) TBPK tablet Take by mouth daily. Take 6 tabs by mouth daily  for 2 days, then 5 tabs for 2 days, then 4 tabs for 2 days, then 3 tabs for 2 days, 2 tabs for 2 days, then 1 tab by mouth daily for 2 days 10/06/20   Alvino Chapel, [place], PA-C    Allergies    Bee venom  Review of Systems   Review of Systems  Constitutional:  Negative for fever.  Skin:  Positive for wound.  Neurological:  Negative for weakness and numbness.   Physical Exam Updated Vital Signs BP (!) 149/87 (BP Location: Right Arm)   Pulse 77   Temp 98.4 F (36.9 C) (Oral)   Resp 20   Ht 6'  (1.829 m)   Wt 108.9 kg   SpO2 100%   BMI 32.55 kg/m   Physical Exam Constitutional:      General: He is not in acute distress.    Appearance: He is well-developed.  Eyes:     Conjunctiva/sclera: Conjunctivae normal.  Cardiovascular:     Rate and Rhythm: Normal rate.  Pulmonary:     Effort: Pulmonary effort is normal.  Musculoskeletal:     Comments: 1 cm superficial laceration finger dorsum of the left thumb distal to the joint.  He has full range of motion of all joints and has brisk cap refill distally.  Skin:    General: Skin is warm and dry.  Neurological:     Mental Status: He is alert and oriented to person, place, and time.    ED Results / Procedures / Treatments   Labs (all labs ordered are listed, but only abnormal results are displayed) Labs Reviewed - No data to display  EKG None  Radiology No results found.  Procedures .Marland KitchenLaceration Repair  Date/Time: 10/13/2020 10:34 PM Performed by: Karrie Meres, PA-C Authorized by: Karrie Meres, PA-C   Consent:    Consent obtained:  Verbal  Consent given by:  Patient   Risks, benefits, and alternatives were discussed: yes     Risks discussed:  Infection and pain   Alternatives discussed:  No treatment Universal protocol:    Procedure explained and questions answered to patient or proxy's satisfaction: yes     Immediately prior to procedure, a time out was called: yes     Patient identity confirmed:  Verbally with patient Anesthesia:    Anesthesia method:  None Laceration details:    Location:  Finger   Finger location:  L thumb   Length (cm):  1 Exploration:    Limited defect created (wound extended): no     Hemostasis achieved with:  Direct pressure   Wound exploration: wound explored through full range of motion and entire depth of wound visualized   Treatment:    Area cleansed with:  Saline   Amount of cleaning:  Standard   Irrigation solution:  Sterile saline   Visualized foreign  bodies/material removed: yes     Debridement:  None   Undermining:  None   Scar revision: no   Skin repair:    Repair method:  Tissue adhesive Approximation:    Approximation:  Close Repair type:    Repair type:  Simple Post-procedure details:    Dressing:  Splint for protection   Procedure completion:  Tolerated   Medications Ordered in ED Medications - No data to display  ED Course  I have reviewed the triage vital signs and the nursing notes.  Pertinent labs & imaging results that were available during my care of the patient were reviewed by me and considered in my medical decision making (see chart for details).    MDM Rules/Calculators/A&P                          Pressure irrigation performed. Wound explored and base of wound visualized in a bloodless field without evidence of foreign body.  Laceration occurred < 8 hours prior to repair which was well tolerated. Pt was dermabonded. Tdap UTD.  Pt has no comorbidities to effect normal wound healing. Pt discharged without antibiotics.   they are to return to the ED sooner for signs of infection. Pt is hemodynamically stable with no complaints prior to dc.    Final Clinical Impression(s) / ED Diagnoses Final diagnoses:  Laceration of left thumb without damage to nail, foreign body presence unspecified, initial encounter    Rx / DC Orders ED Discharge Orders     None        Karrie Meres, PA-C 10/13/20 2233    Karrie Meres, PA-C 10/13/20 2235    Pollyann Savoy, MD 10/14/20 1452

## 2020-10-13 NOTE — Discharge Instructions (Addendum)
Please follow up with your primary care provider within 5-7 days for re-evaluation of your symptoms. If you do not have a primary care provider, information for a healthcare clinic has been provided for you to make arrangements for follow up care. Please return to the emergency department for any new or worsening symptoms. ° °

## 2021-08-28 ENCOUNTER — Encounter: Payer: Self-pay | Admitting: Emergency Medicine

## 2021-08-28 ENCOUNTER — Other Ambulatory Visit: Payer: Self-pay

## 2021-08-28 ENCOUNTER — Ambulatory Visit
Admission: EM | Admit: 2021-08-28 | Discharge: 2021-08-28 | Disposition: A | Discharge status: Home / Self Care | Payer: BC Managed Care – PPO | Attending: Family Medicine | Admitting: Family Medicine

## 2021-08-28 DIAGNOSIS — M79674 Pain in right toe(s): Secondary | ICD-10-CM

## 2021-08-28 MED ORDER — PREDNISONE 20 MG PO TABS: 40.0000 mg | ORAL_TABLET | Freq: Every day | ORAL | 0 refills | Status: DC

## 2021-08-28 NOTE — Discharge Instructions (Signed)
Try tart cherry supplements to see if this helps with your pain in case you are dealing with a gout flare

## 2021-08-28 NOTE — ED Triage Notes (Signed)
Pt reports right great toe pain. Pt denies any known injury. Pt reports pain,swelling, tenderness to touch, and pain with ambulation for last several weeks.

## 2021-08-28 NOTE — ED Provider Notes (Signed)
[facility]    CSN: [id number] Arrival date & time: 08/28/21  1751      History   Chief Complaint Chief Complaint  Patient presents with   Toe Pain    HPI Tyler Solomon is a 26 y.o. male.   Presenting today with almost a week of right great toe pain, swelling, redness, warmth.  Denies any injury, fever, skin change, weakness, numbness, tingling, loss of range of motion.  Has been trying ice, soaks, changing shoes and socks with no relief.  No past history of similar issues.   Past Medical History:  Diagnosis Date   Hypertension     There are no problems to display for this patient.   Past Surgical History:  Procedure Laterality Date   CERVICAL FUSION     WISDOM TOOTH EXTRACTION         Home Medications    Prior to Admission medications   Medication Sig Start Date End Date Taking? Authorizing Provider  predniSONE (DELTASONE) 20 MG tablet Take 2 tablets (40 mg total) by mouth daily with breakfast. 08/28/21  Yes Particia Nearing, PA-C  cyclobenzaprine (FLEXERIL) 10 MG tablet Take 1 tablet (10 mg total) by mouth 2 (two) times daily as needed for muscle spasms. 10/06/20   Wurst, [place], PA-C  predniSONE (STERAPRED UNI-PAK 21 TAB) 10 MG (21) TBPK tablet Take by mouth daily. Take 6 tabs by mouth daily  for 2 days, then 5 tabs for 2 days, then 4 tabs for 2 days, then 3 tabs for 2 days, 2 tabs for 2 days, then 1 tab by mouth daily for 2 days 10/06/20   Alvino Chapel [place], PA-C    Family History Family History  Problem Relation Age of Onset   Healthy Mother    Healthy Father     Social History Social History   Tobacco Use   Smoking status: Every Day    Types: Cigars   Smokeless tobacco: Never  Substance Use Topics   Alcohol use: Yes    Comment: occ   Drug use: No     Allergies   Bee venom   Review of Systems Review of Systems PER HPI  Physical Exam Triage Vital Signs ED Triage Vitals  Enc Vitals Group     BP 08/28/21 1800 (!) 142/75      Pulse Rate 08/28/21 1800 60     Resp 08/28/21 1800 18     Temp 08/28/21 1800 98 F (36.7 C)     Temp Source 08/28/21 1800 Oral     SpO2 08/28/21 1800 99 %     Weight 08/28/21 1801 240 lb (108.9 kg)     Height 08/28/21 1801 6' (1.829 m)     Head Circumference --      Peak Flow --      Pain Score 08/28/21 1801 8     Pain Loc --      Pain Edu? --      Excl. in GC? --    No data found.  Updated Vital Signs BP (!) 142/75 (BP Location: Right Arm)   Pulse 60   Temp 98 F (36.7 C) (Oral)   Resp 18   Ht 6' (1.829 m)   Wt 240 lb (108.9 kg)   SpO2 99%   BMI 32.55 kg/m   Visual Acuity Right Eye Distance:   Left Eye Distance:   Bilateral Distance:    Right Eye Near:   Left Eye Near:    Bilateral Near:  Physical Exam Vitals and nursing note reviewed.  Constitutional:      Appearance: Normal appearance.  HENT:     Head: Atraumatic.  Eyes:     Extraocular Movements: Extraocular movements intact.     Conjunctiva/sclera: Conjunctivae normal.  Cardiovascular:     Rate and Rhythm: Normal rate and regular rhythm.  Pulmonary:     Effort: Pulmonary effort is normal.     Breath sounds: Normal breath sounds.  Musculoskeletal:        General: Swelling and tenderness present. No deformity or signs of injury. Normal range of motion.     Cervical back: Normal range of motion and neck supple.     Comments: Base of right great toe erythematous, edematous, warm, significantly tender to palpation.  Range of motion intact but very painful  Skin:    General: Skin is warm and dry.     Findings: Erythema present.  Neurological:     General: No focal deficit present.     Mental Status: He is oriented to person, place, and time.     Comments: Right foot neurovascularly intact  Psychiatric:        Mood and Affect: Mood normal.        Thought Content: Thought content normal.        Judgment: Judgment normal.     UC Treatments / Results  Labs (all labs ordered are listed, but  only abnormal results are displayed) Labs Reviewed - No data to display  EKG   Radiology No results found.  Procedures Procedures (including critical care time)  Medications Ordered in UC Medications - No data to display  Initial Impression / Assessment and Plan / UC Course  I have reviewed the triage vital signs and the nursing notes.  Pertinent labs & imaging results that were available during my care of the patient were reviewed by me and considered in my medical decision making (see chart for details).     Most consistent with a gout flare.  Declines x-ray with shared decision making, treat with prednisone, trial tart cherry supplements and discussed gout prevention diet.  Return for worsening symptoms.  Final Clinical Impressions(s) / UC Diagnoses   Final diagnoses:  Great toe pain, right     Discharge Instructions      Try tart cherry supplements to see if this helps with your pain in case you are dealing with a gout flare    ED Prescriptions     Medication Sig Dispense Auth. Provider   predniSONE (DELTASONE) 20 MG tablet Take 2 tablets (40 mg total) by mouth daily with breakfast. 10 tablet Particia Nearing, [place]      PDMP not reviewed this encounter.   Particia Nearing, [place] 08/28/21 1859

## 2021-11-19 ENCOUNTER — Encounter (HOSPITAL_COMMUNITY): Payer: Self-pay

## 2021-11-19 ENCOUNTER — Emergency Department (HOSPITAL_COMMUNITY): Payer: Self-pay

## 2021-11-19 ENCOUNTER — Observation Stay (HOSPITAL_COMMUNITY)
Admission: EM | Admit: 2021-11-19 | Discharge: 2021-11-20 | Disposition: A | Discharge status: Home / Self Care | Payer: Self-pay | Attending: Surgery | Admitting: Surgery

## 2021-11-19 DIAGNOSIS — K37 Unspecified appendicitis: Principal | ICD-10-CM | POA: Diagnosis present

## 2021-11-19 DIAGNOSIS — F17200 Nicotine dependence, unspecified, uncomplicated: Secondary | ICD-10-CM

## 2021-11-19 DIAGNOSIS — K3580 Unspecified acute appendicitis: Principal | ICD-10-CM | POA: Insufficient documentation

## 2021-11-19 DIAGNOSIS — Z79899 Other long term (current) drug therapy: Secondary | ICD-10-CM | POA: Insufficient documentation

## 2021-11-19 DIAGNOSIS — F17290 Nicotine dependence, other tobacco product, uncomplicated: Secondary | ICD-10-CM | POA: Insufficient documentation

## 2021-11-19 DIAGNOSIS — I10 Essential (primary) hypertension: Secondary | ICD-10-CM | POA: Insufficient documentation

## 2021-11-19 DIAGNOSIS — D72829 Elevated white blood cell count, unspecified: Secondary | ICD-10-CM | POA: Insufficient documentation

## 2021-11-19 LAB — URINALYSIS, ROUTINE W REFLEX MICROSCOPIC
Bilirubin Urine: NEGATIVE
Glucose, UA: NEGATIVE mg/dL
Ketones, ur: NEGATIVE mg/dL
Leukocytes,Ua: NEGATIVE
Nitrite: NEGATIVE
Protein, ur: 100 mg/dL — AB
Specific Gravity, Urine: 1.026 (ref 1.005–1.030)
pH: 6 (ref 5.0–8.0)

## 2021-11-19 LAB — COMPREHENSIVE METABOLIC PANEL
ALT: 42 U/L (ref 0–44)
AST: 31 U/L (ref 15–41)
Albumin: 4.4 g/dL (ref 3.5–5.0)
Alkaline Phosphatase: 105 U/L (ref 38–126)
Anion gap: 9 (ref 5–15)
BUN: 13 mg/dL (ref 6–20)
CO2: 26 mmol/L (ref 22–32)
Calcium: 9.7 mg/dL (ref 8.9–10.3)
Chloride: 101 mmol/L (ref 98–111)
Creatinine, Ser: 0.9 mg/dL (ref 0.61–1.24)
GFR, Estimated: >60 mL/min (ref >60)
Glucose, Bld: 152 mg/dL — ABNORMAL HIGH (ref 70–99)
Potassium: 4.4 mmol/L (ref 3.5–5.1)
Sodium: 136 mmol/L (ref 135–145)
Total Bilirubin: 0.7 mg/dL (ref 0.3–1.2)
Total Protein: 8.8 g/dL — ABNORMAL HIGH (ref 6.5–8.1)

## 2021-11-19 LAB — CBC
HCT: 48.6 % (ref 39.0–52.0)
Hemoglobin: 16.6 g/dL (ref 13.0–17.0)
MCH: 31.7 pg (ref 26.0–34.0)
MCHC: 34.2 g/dL (ref 30.0–36.0)
MCV: 92.9 fL (ref 80.0–100.0)
Platelets: 259 10*3/uL (ref 150–400)
RBC: 5.23 MIL/uL (ref 4.22–5.81)
RDW: 14.1 % (ref 11.5–15.5)
WBC: 15.9 10*3/uL — ABNORMAL HIGH (ref 4.0–10.5)
nRBC: 0 % (ref 0.0–0.2)

## 2021-11-19 LAB — LIPASE, BLOOD: Lipase: 40 U/L (ref 11–51)

## 2021-11-19 MED ORDER — LACTATED RINGERS IV BOLUS
1000.0000 mL | Freq: Once | INTRAVENOUS | Status: AC
  Administered 2021-11-19: 1000 mL via INTRAVENOUS

## 2021-11-19 MED ORDER — IOHEXOL 300 MG/ML  SOLN
100.0000 mL | Freq: Once | INTRAMUSCULAR | Status: AC | PRN
  Administered 2021-11-19: 100 mL via INTRAVENOUS

## 2021-11-19 MED ORDER — LACTATED RINGERS IV SOLN: INTRAVENOUS | Status: DC

## 2021-11-19 MED ORDER — ONDANSETRON HCL 4 MG/2ML IJ SOLN
4.0000 mg | Freq: Four times a day (QID) | INTRAMUSCULAR | Status: DC | PRN
  Administered 2021-11-19: 4 mg via INTRAVENOUS
  Filled 2021-11-19: qty 2

## 2021-11-19 MED ORDER — MORPHINE SULFATE (PF) 4 MG/ML IV SOLN: 4.0000 mg | INTRAVENOUS | Status: DC | PRN

## 2021-11-19 MED ORDER — PIPERACILLIN-TAZOBACTAM 3.375 G IVPB 30 MIN
3.3750 g | INTRAVENOUS | Status: AC
  Administered 2021-11-19: 3.375 g via INTRAVENOUS
  Filled 2021-11-19: qty 50

## 2021-11-19 MED ORDER — PIPERACILLIN-TAZOBACTAM 3.375 G IVPB
3.3750 g | Freq: Three times a day (TID) | INTRAVENOUS | Status: DC
  Administered 2021-11-20 (×2): 3.375 g via INTRAVENOUS
  Filled 2021-11-19 (×2): qty 50

## 2021-11-19 MED ORDER — MORPHINE SULFATE (PF) 4 MG/ML IV SOLN
4.0000 mg | Freq: Once | INTRAVENOUS | Status: AC
  Administered 2021-11-19: 4 mg via INTRAVENOUS
  Filled 2021-11-19: qty 1

## 2021-11-19 NOTE — Progress Notes (Signed)
Pharmacy Antibiotic Note  Tyler Solomon is a 26 y.o. male admitted on 11/19/2021 with  concern for appendicitis .  Pharmacy has been consulted for Zosyn dosing.  Plan: Zosyn 3.375gm IV q8h Will f/u renal function, micro data, and pt's clinical condition  Height: 6' (182.9 cm) Weight: 106 kg (233 lb 11 oz) IBW/kg (Calculated) : 77.6  Temp (24hrs), Avg:98.2 F (36.8 C), Min:98.2 F (36.8 C), Max:98.2 F (36.8 C)  Recent Labs  Lab 11/19/21 1739  WBC 15.9*  CREATININE 0.90    Estimated Creatinine Clearance: 153.8 mL/min (by C-G formula based on SCr of 0.9 mg/dL).    Allergies  Allergen Reactions   Bee Venom Swelling    Antimicrobials this admission: 8/21 Zosyn >>   Microbiology results: Pending  Thank you for allowing pharmacy to be a part of this patient's care.  Christoper Fabian, PharmD, BCPS Please see amion for complete clinical pharmacist phone list 11/19/2021 10:49 PM

## 2021-11-19 NOTE — ED Provider Notes (Signed)
[facility] EMERGENCY DEPARTMENT Provider Note   CSN: [id number] Arrival date & time: 11/19/21  1637     History {Add pertinent medical, surgical, social history, OB history to HPI:1} Chief Complaint  Patient presents with   Abdominal Pain    Tyler Solomon is a 26 y.o. male.   Abdominal Pain Associated symptoms: chills, nausea and vomiting   Patient presents for right lower quadrant abdominal pain.  Medical history includes HTN.  Patient was in his normal state of health yesterday and last night.  This morning, he awoke at approximately 6 AM and had a sharp pain in his right lower quadrant.  Later in the morning, he had nausea and vomiting.  He has continued to experience right lower quadrant pain.  He has had intermittent nausea but denies any currently.  He has had anorexia throughout the day.  Currently, pain is 7/10 in severity.  At maximum, it was 10/10.  He reports that he has had similar episodes in the past, although never as severe, which resolved on their own.  His last bowel movement was this morning.     Home Medications Prior to Admission medications   Medication Sig Start Date End Date Taking? Authorizing Provider  cyclobenzaprine (FLEXERIL) 10 MG tablet Take 1 tablet (10 mg total) by mouth 2 (two) times daily as needed for muscle spasms. 10/06/20   Wurst, [place], PA-C  predniSONE (DELTASONE) 20 MG tablet Take 2 tablets (40 mg total) by mouth daily with breakfast. 08/28/21   Particia Nearing, PA-C  predniSONE (STERAPRED UNI-PAK 21 TAB) 10 MG (21) TBPK tablet Take by mouth daily. Take 6 tabs by mouth daily  for 2 days, then 5 tabs for 2 days, then 4 tabs for 2 days, then 3 tabs for 2 days, 2 tabs for 2 days, then 1 tab by mouth daily for 2 days 10/06/20   Alvino Chapel, [place], PA-C      Allergies    Bee venom    Review of Systems   Review of Systems  Constitutional:  Positive for appetite change and chills.  Gastrointestinal:  Positive for abdominal pain, nausea and  vomiting.    Physical Exam Updated Vital Signs BP (!) 143/83   Pulse 79   Temp 98.2 F (36.8 C) (Oral)   Resp 17   Ht 6' (1.829 m)   Wt 106 kg   SpO2 100%   BMI 31.69 kg/m  Physical Exam Vitals and nursing note reviewed.  Constitutional:      General: He is not in acute distress.    Appearance: He is well-developed and normal weight. He is not ill-appearing, toxic-appearing or diaphoretic.  HENT:     Head: Normocephalic and atraumatic.     Mouth/Throat:     Mouth: Mucous membranes are moist.     Pharynx: Oropharynx is clear.  Eyes:     Conjunctiva/sclera: Conjunctivae normal.  Cardiovascular:     Rate and Rhythm: Normal rate and regular rhythm.  Pulmonary:     Effort: Pulmonary effort is normal. No respiratory distress.  Abdominal:     Palpations: Abdomen is soft.     Tenderness: There is abdominal tenderness in the right lower quadrant. There is rebound. Positive signs include Rovsing's sign and McBurney's sign.  Musculoskeletal:        General: No swelling.     Cervical back: Neck supple.  Skin:    General: Skin is warm and dry.     Capillary Refill: Capillary refill takes less than  2 seconds.     Coloration: Skin is not cyanotic, jaundiced or pale.  Neurological:     General: No focal deficit present.     Mental Status: He is alert and oriented to person, place, and time.  Psychiatric:        Mood and Affect: Mood normal.        Behavior: Behavior normal.     ED Results / Procedures / Treatments   Labs (all labs ordered are listed, but only abnormal results are displayed) Labs Reviewed  COMPREHENSIVE METABOLIC PANEL - Abnormal; Notable for the following components:      Result Value   Glucose, Bld 152 (*)    Total Protein 8.8 (*)    All other components within normal limits  CBC - Abnormal; Notable for the following components:   WBC 15.9 (*)    All other components within normal limits  LIPASE, BLOOD  URINALYSIS, ROUTINE W REFLEX MICROSCOPIC     EKG None  Radiology No results found.  Procedures Procedures  {Document cardiac monitor, telemetry assessment procedure when appropriate:1}  Medications Ordered in ED Medications - No data to display  ED Course/ Medical Decision Making/ A&P                           Medical Decision Making Amount and/or Complexity of Data Reviewed Labs: ordered. Radiology: ordered.  Risk Prescription drug management.   ***  {Document critical care time when appropriate:1} {Document review of labs and clinical decision tools ie heart score, Chads2Vasc2 etc:1}  {Document your independent review of radiology images, and any outside records:1} {Document your discussion with family members, caretakers, and with consultants:1} {Document social determinants of health affecting pt's care:1} {Document your decision making why or why not admission, treatments were needed:1} Final Clinical Impression(s) / ED Diagnoses Final diagnoses:  None    Rx / DC Orders ED Discharge Orders     None

## 2021-11-19 NOTE — ED Triage Notes (Signed)
Pt c/o RLQ pain since 0600 . Pt endorses N/V/D, chills. Concerned for appendix.

## 2021-11-19 NOTE — Progress Notes (Signed)
Called by Dr. Durwin Nora regarding this patient.  He presents with less than 1 day history of right lower quadrant pain that started early this morning.  He underwent a CT abdomen and pelvis in the ED, which demonstrated acute uncomplicated appendicitis.  His blood work is significant for leukocytosis of 15.9.  Patient's past medical history significant for hypertension.  At this time, I recommend admission to the hospital with fluid resuscitation, IV antibiotics, NPO, and plan for laparoscopic appendectomy tomorrow.  Please admit to the hospitalist tonight, and I will plan to change admission to myself tomorrow morning.  Please call with any questions or concerns.  Theophilus Kinds, DO [facility] [address] [place], [place] [address] [phone] (office)

## 2021-11-20 ENCOUNTER — Inpatient Hospital Stay (HOSPITAL_BASED_OUTPATIENT_CLINIC_OR_DEPARTMENT_OTHER): Payer: Self-pay | Admitting: Anesthesiology

## 2021-11-20 ENCOUNTER — Inpatient Hospital Stay (HOSPITAL_COMMUNITY): Payer: Self-pay | Admitting: Anesthesiology

## 2021-11-20 ENCOUNTER — Encounter (HOSPITAL_COMMUNITY)
Admission: EM | Disposition: A | Discharge status: Home / Self Care | Payer: Self-pay | Source: Home / Self Care | Attending: Emergency Medicine

## 2021-11-20 ENCOUNTER — Encounter (HOSPITAL_COMMUNITY): Payer: Self-pay | Admitting: Family Medicine

## 2021-11-20 ENCOUNTER — Other Ambulatory Visit: Payer: Self-pay

## 2021-11-20 DIAGNOSIS — K353 Acute appendicitis with localized peritonitis, without perforation or gangrene: Secondary | ICD-10-CM

## 2021-11-20 DIAGNOSIS — I10 Essential (primary) hypertension: Secondary | ICD-10-CM

## 2021-11-20 DIAGNOSIS — F17200 Nicotine dependence, unspecified, uncomplicated: Secondary | ICD-10-CM

## 2021-11-20 DIAGNOSIS — D72829 Elevated white blood cell count, unspecified: Secondary | ICD-10-CM

## 2021-11-20 DIAGNOSIS — K3580 Unspecified acute appendicitis: Secondary | ICD-10-CM

## 2021-11-20 HISTORY — PX: LAPAROSCOPIC APPENDECTOMY: SHX408

## 2021-11-20 LAB — COMPREHENSIVE METABOLIC PANEL
ALT: 32 U/L (ref 0–44)
AST: 24 U/L (ref 15–41)
Albumin: 3.7 g/dL (ref 3.5–5.0)
Alkaline Phosphatase: 86 U/L (ref 38–126)
Anion gap: 6 (ref 5–15)
BUN: 12 mg/dL (ref 6–20)
CO2: 26 mmol/L (ref 22–32)
Calcium: 9.2 mg/dL (ref 8.9–10.3)
Chloride: 104 mmol/L (ref 98–111)
Creatinine, Ser: 0.88 mg/dL (ref 0.61–1.24)
GFR, Estimated: >60 mL/min (ref >60)
Glucose, Bld: 103 mg/dL — ABNORMAL HIGH (ref 70–99)
Potassium: 3.5 mmol/L (ref 3.5–5.1)
Sodium: 136 mmol/L (ref 135–145)
Total Bilirubin: 1.1 mg/dL (ref 0.3–1.2)
Total Protein: 7.4 g/dL (ref 6.5–8.1)

## 2021-11-20 LAB — CBC WITH DIFFERENTIAL/PLATELET
Abs Immature Granulocytes: 0.06 10*3/uL (ref 0.00–0.07)
Basophils Absolute: 0.1 10*3/uL (ref 0.0–0.1)
Basophils Relative: 1 %
Eosinophils Absolute: 0.1 10*3/uL (ref 0.0–0.5)
Eosinophils Relative: 0 %
HCT: 43.3 % (ref 39.0–52.0)
Hemoglobin: 14.9 g/dL (ref 13.0–17.0)
Immature Granulocytes: 1 %
Lymphocytes Relative: 25 %
Lymphs Abs: 3.3 10*3/uL (ref 0.7–4.0)
MCH: 31.8 pg (ref 26.0–34.0)
MCHC: 34.4 g/dL (ref 30.0–36.0)
MCV: 92.3 fL (ref 80.0–100.0)
Monocytes Absolute: 1.1 10*3/uL — ABNORMAL HIGH (ref 0.1–1.0)
Monocytes Relative: 8 %
Neutro Abs: 8.7 10*3/uL — ABNORMAL HIGH (ref 1.7–7.7)
Neutrophils Relative %: 65 %
Platelets: 238 10*3/uL (ref 150–400)
RBC: 4.69 MIL/uL (ref 4.22–5.81)
RDW: 14 % (ref 11.5–15.5)
WBC: 13.3 10*3/uL — ABNORMAL HIGH (ref 4.0–10.5)
nRBC: 0 % (ref 0.0–0.2)

## 2021-11-20 LAB — HIV ANTIBODY (ROUTINE TESTING W REFLEX): HIV Screen 4th Generation wRfx: NONREACTIVE

## 2021-11-20 LAB — MAGNESIUM: Magnesium: 1.7 mg/dL (ref 1.7–2.4)

## 2021-11-20 SURGERY — APPENDECTOMY, LAPAROSCOPIC
Anesthesia: General | Site: Abdomen

## 2021-11-20 MED ORDER — DOCUSATE SODIUM 100 MG PO CAPS: 100.0000 mg | ORAL_CAPSULE | Freq: Two times a day (BID) | ORAL | 2 refills | Status: AC

## 2021-11-20 MED ORDER — BUPIVACAINE LIPOSOME 1.3 % IJ SUSP
INTRAMUSCULAR | Status: AC
  Filled 2021-11-20: qty 20

## 2021-11-20 MED ORDER — CHLORHEXIDINE GLUCONATE 0.12 % MT SOLN: 15.0000 mL | Freq: Once | OROMUCOSAL | Status: DC

## 2021-11-20 MED ORDER — ROCURONIUM BROMIDE 10 MG/ML (PF) SYRINGE
PREFILLED_SYRINGE | INTRAVENOUS | Status: AC
  Filled 2021-11-20: qty 10

## 2021-11-20 MED ORDER — ACETAMINOPHEN 325 MG PO TABS: 650.0000 mg | ORAL_TABLET | Freq: Four times a day (QID) | ORAL | Status: DC | PRN

## 2021-11-20 MED ORDER — ORAL CARE MOUTH RINSE: 15.0000 mL | Freq: Once | OROMUCOSAL | Status: DC

## 2021-11-20 MED ORDER — DEXMEDETOMIDINE HCL IN NACL 80 MCG/20ML IV SOLN
INTRAVENOUS | Status: AC
  Filled 2021-11-20: qty 20

## 2021-11-20 MED ORDER — SODIUM CHLORIDE 0.9 % IR SOLN
Status: DC | PRN
  Administered 2021-11-20: 1000 mL

## 2021-11-20 MED ORDER — ONDANSETRON HCL 4 MG PO TABS: 4.0000 mg | ORAL_TABLET | Freq: Four times a day (QID) | ORAL | Status: DC | PRN

## 2021-11-20 MED ORDER — DEXAMETHASONE SODIUM PHOSPHATE 10 MG/ML IJ SOLN
INTRAMUSCULAR | Status: AC
  Filled 2021-11-20: qty 1

## 2021-11-20 MED ORDER — LIDOCAINE HCL (PF) 2 % IJ SOLN
INTRAMUSCULAR | Status: AC
  Filled 2021-11-20: qty 5

## 2021-11-20 MED ORDER — ACETAMINOPHEN 650 MG RE SUPP: 650.0000 mg | Freq: Four times a day (QID) | RECTAL | Status: DC | PRN

## 2021-11-20 MED ORDER — FENTANYL CITRATE PF 50 MCG/ML IJ SOSY
25.0000 ug | PREFILLED_SYRINGE | INTRAMUSCULAR | Status: DC | PRN
  Administered 2021-11-20 (×2): 50 ug via INTRAVENOUS
  Filled 2021-11-20 (×2): qty 1

## 2021-11-20 MED ORDER — SUGAMMADEX SODIUM 500 MG/5ML IV SOLN
INTRAVENOUS | Status: AC
  Filled 2021-11-20: qty 5

## 2021-11-20 MED ORDER — MIDAZOLAM HCL 2 MG/2ML IJ SOLN
INTRAMUSCULAR | Status: AC
  Filled 2021-11-20: qty 2

## 2021-11-20 MED ORDER — SUGAMMADEX SODIUM 200 MG/2ML IV SOLN
INTRAVENOUS | Status: DC | PRN
  Administered 2021-11-20: 300 mg via INTRAVENOUS

## 2021-11-20 MED ORDER — LIDOCAINE 2% (20 MG/ML) 5 ML SYRINGE
INTRAMUSCULAR | Status: DC | PRN
  Administered 2021-11-20: 80 mg via INTRAVENOUS

## 2021-11-20 MED ORDER — ONDANSETRON HCL 4 MG/2ML IJ SOLN: 4.0000 mg | Freq: Four times a day (QID) | INTRAMUSCULAR | Status: DC | PRN

## 2021-11-20 MED ORDER — SODIUM CHLORIDE 0.9 % IV SOLN: INTRAVENOUS | Status: DC

## 2021-11-20 MED ORDER — FENTANYL CITRATE (PF) 250 MCG/5ML IJ SOLN
INTRAMUSCULAR | Status: DC | PRN
Start: 2021-11-20 — End: 2021-11-20
  Administered 2021-11-20: 100 ug via INTRAVENOUS
  Administered 2021-11-20: 50 ug via INTRAVENOUS
  Administered 2021-11-20: 100 ug via INTRAVENOUS

## 2021-11-20 MED ORDER — FENTANYL CITRATE (PF) 250 MCG/5ML IJ SOLN
INTRAMUSCULAR | Status: AC
  Filled 2021-11-20: qty 5

## 2021-11-20 MED ORDER — ONDANSETRON HCL 4 MG/2ML IJ SOLN
4.0000 mg | Freq: Once | INTRAMUSCULAR | Status: DC | PRN
Start: 2021-11-20 — End: 2021-11-20

## 2021-11-20 MED ORDER — PROPOFOL 10 MG/ML IV BOLUS
INTRAVENOUS | Status: DC | PRN
  Administered 2021-11-20: 200 mg via INTRAVENOUS

## 2021-11-20 MED ORDER — OXYCODONE HCL 5 MG/5ML PO SOLN: 5.0000 mg | Freq: Once | ORAL | Status: AC | PRN

## 2021-11-20 MED ORDER — DEXMEDETOMIDINE (PRECEDEX) IN NS 20 MCG/5ML (4 MCG/ML) IV SYRINGE
PREFILLED_SYRINGE | INTRAVENOUS | Status: DC | PRN
  Administered 2021-11-20: 4 ug via INTRAVENOUS
  Administered 2021-11-20 (×2): 8 ug via INTRAVENOUS

## 2021-11-20 MED ORDER — ONDANSETRON HCL 4 MG/2ML IJ SOLN
INTRAMUSCULAR | Status: AC
  Filled 2021-11-20: qty 2

## 2021-11-20 MED ORDER — BUPIVACAINE LIPOSOME 1.3 % IJ SUSP
INTRAMUSCULAR | Status: DC | PRN
  Administered 2021-11-20: 20 mL

## 2021-11-20 MED ORDER — OXYCODONE HCL 5 MG PO TABS
5.0000 mg | ORAL_TABLET | ORAL | Status: DC | PRN
  Filled 2021-11-20: qty 1

## 2021-11-20 MED ORDER — HEMOSTATIC AGENTS (NO CHARGE) OPTIME
TOPICAL | Status: DC | PRN
  Administered 2021-11-20 (×2): 1 via TOPICAL

## 2021-11-20 MED ORDER — HEPARIN SODIUM (PORCINE) 5000 UNIT/ML IJ SOLN
5000.0000 [IU] | Freq: Three times a day (TID) | INTRAMUSCULAR | Status: DC
  Administered 2021-11-20: 5000 [IU] via SUBCUTANEOUS
  Filled 2021-11-20: qty 1

## 2021-11-20 MED ORDER — LACTATED RINGERS IV SOLN: INTRAVENOUS | Status: DC

## 2021-11-20 MED ORDER — KETOROLAC TROMETHAMINE 30 MG/ML IJ SOLN
INTRAMUSCULAR | Status: AC
  Filled 2021-11-20: qty 1

## 2021-11-20 MED ORDER — OXYCODONE HCL 5 MG PO TABS
5.0000 mg | ORAL_TABLET | Freq: Once | ORAL | Status: AC | PRN
  Administered 2021-11-20: 5 mg via ORAL

## 2021-11-20 MED ORDER — ROCURONIUM BROMIDE 10 MG/ML (PF) SYRINGE
PREFILLED_SYRINGE | INTRAVENOUS | Status: DC | PRN
  Administered 2021-11-20: 60 mg via INTRAVENOUS

## 2021-11-20 MED ORDER — MIDAZOLAM HCL 5 MG/5ML IJ SOLN
INTRAMUSCULAR | Status: DC | PRN
  Administered 2021-11-20: 2 mg via INTRAVENOUS

## 2021-11-20 MED ORDER — OXYCODONE HCL 5 MG PO TABS: 5.0000 mg | ORAL_TABLET | Freq: Four times a day (QID) | ORAL | 0 refills | Status: AC | PRN

## 2021-11-20 MED ORDER — ONDANSETRON HCL 4 MG/2ML IJ SOLN
INTRAMUSCULAR | Status: DC | PRN
  Administered 2021-11-20: 4 mg via INTRAVENOUS

## 2021-11-20 MED ORDER — DEXAMETHASONE SODIUM PHOSPHATE 10 MG/ML IJ SOLN
INTRAMUSCULAR | Status: DC | PRN
  Administered 2021-11-20: 10 mg via INTRAVENOUS

## 2021-11-20 MED ORDER — MORPHINE SULFATE (PF) 2 MG/ML IV SOLN
2.0000 mg | INTRAVENOUS | Status: DC | PRN
  Administered 2021-11-20: 2 mg via INTRAVENOUS
  Filled 2021-11-20: qty 1

## 2021-11-20 MED ORDER — ACETAMINOPHEN 500 MG PO TABS: 1000.0000 mg | ORAL_TABLET | Freq: Four times a day (QID) | ORAL | 0 refills | Status: AC

## 2021-11-20 SURGICAL SUPPLY — 54 items
APPLIER CLIP ROT 10 11.4 M/L (STAPLE) ×1
BLADE SURG 15 STRL LF DISP TIS (BLADE) ×1 IMPLANT
BLADE SURG 15 STRL SS (BLADE) ×1
CHLORAPREP W/TINT 26 (MISCELLANEOUS) ×1 IMPLANT
CLIP APPLIE ROT 10 11.4 M/L (STAPLE) IMPLANT
CLOTH BEACON ORANGE TIMEOUT ST (SAFETY) ×1 IMPLANT
COVER LIGHT HANDLE STERIS (MISCELLANEOUS) ×2 IMPLANT
CUTTER FLEX LINEAR 45M (STAPLE) ×1 IMPLANT
DERMABOND ADVANCED (GAUZE/BANDAGES/DRESSINGS) ×1
DERMABOND ADVANCED .7 DNX12 (GAUZE/BANDAGES/DRESSINGS) ×1 IMPLANT
DISSECTOR BLUNT TIP ENDO 5MM (MISCELLANEOUS) IMPLANT
ELECT REM PT RETURN 9FT ADLT (ELECTROSURGICAL) ×1
ELECTRODE REM PT RTRN 9FT ADLT (ELECTROSURGICAL) ×1 IMPLANT
GAUZE 4X4 16PLY ~~LOC~~+RFID DBL (SPONGE) IMPLANT
GLOVE BIO SURGEON STRL SZ8 (GLOVE) IMPLANT
GLOVE BIOGEL PI IND STRL 6.5 (GLOVE) ×1 IMPLANT
GLOVE BIOGEL PI IND STRL 7.0 (GLOVE) ×2 IMPLANT
GLOVE BIOGEL PI IND STRL 8.5 (GLOVE) IMPLANT
GLOVE BIOGEL PI INDICATOR 6.5 (GLOVE) ×1
GLOVE BIOGEL PI INDICATOR 7.0 (GLOVE) ×2
GLOVE BIOGEL PI INDICATOR 8.5 (GLOVE) ×1
GLOVE SURG SS PI 6.5 STRL IVOR (GLOVE) ×2 IMPLANT
GOWN STRL REUS W/TWL LRG LVL3 (GOWN DISPOSABLE) ×2 IMPLANT
GOWN STRL REUS W/TWL XL LVL3 (GOWN DISPOSABLE) IMPLANT
HEMOSTAT SNOW SURGICEL 2X4 (HEMOSTASIS) IMPLANT
INST SET LAPROSCOPIC AP (KITS) ×1 IMPLANT
KIT TURNOVER KIT A (KITS) ×1 IMPLANT
MANIFOLD NEPTUNE II (INSTRUMENTS) ×1 IMPLANT
NDL HYPO 18GX1.5 BLUNT FILL (NEEDLE) ×1 IMPLANT
NDL HYPO 21X1.5 SAFETY (NEEDLE) ×1 IMPLANT
NDL INSUFFLATION 14GA 120MM (NEEDLE) ×1 IMPLANT
NEEDLE HYPO 18GX1.5 BLUNT FILL (NEEDLE) ×1 IMPLANT
NEEDLE HYPO 21X1.5 SAFETY (NEEDLE) ×1 IMPLANT
NEEDLE INSUFFLATION 14GA 120MM (NEEDLE) ×1 IMPLANT
NS IRRIG 1000ML POUR BTL (IV SOLUTION) ×1 IMPLANT
PACK LAP CHOLE LZT030E (CUSTOM PROCEDURE TRAY) ×1 IMPLANT
PAD ARMBOARD 7.5X6 YLW CONV (MISCELLANEOUS) ×1 IMPLANT
RELOAD STAPLE 45 3.5 BLU ETS (ENDOMECHANICALS) IMPLANT
RELOAD STAPLE TA45 3.5 REG BLU (ENDOMECHANICALS) ×1 IMPLANT
SET BASIN LINEN APH (SET/KITS/TRAYS/PACK) ×1 IMPLANT
SET TUBE SMOKE EVAC HIGH FLOW (TUBING) ×1 IMPLANT
SHEARS HARMONIC ACE PLUS 36CM (ENDOMECHANICALS) ×1 IMPLANT
SUT MNCRL AB 4-0 PS2 18 (SUTURE) ×2 IMPLANT
SUT VICRYL 0 UR6 27IN ABS (SUTURE) ×1 IMPLANT
SYR 20ML LL LF (SYRINGE) ×2 IMPLANT
SYS BAG RETRIEVAL 10MM (BASKET) ×1
SYSTEM BAG RETRIEVAL 10MM (BASKET) ×1 IMPLANT
TRAY FOLEY W/BAG SLVR 16FR (SET/KITS/TRAYS/PACK) ×1
TRAY FOLEY W/BAG SLVR 16FR ST (SET/KITS/TRAYS/PACK) ×1 IMPLANT
TROCAR Z-THAD FIOS HNDL 12X100 (TROCAR) ×1 IMPLANT
TROCAR Z-THRD FIOS HNDL 11X100 (TROCAR) ×1 IMPLANT
TROCAR Z-THREAD FIOS 5X100MM (TROCAR) ×1 IMPLANT
TUBE CONNECTING 12X1/4 (SUCTIONS) ×1 IMPLANT
WARMER LAPAROSCOPE (MISCELLANEOUS) ×1 IMPLANT

## 2021-11-20 NOTE — Anesthesia Preprocedure Evaluation (Signed)
Anesthesia Evaluation  Patient identified by MRN, date of birth, ID band Patient awake    Reviewed: Allergy & Precautions, H&P , NPO status , Patient's Chart, lab work & pertinent test results, reviewed documented beta blocker date and time   Airway Mallampati: II  TM Distance: >3 FB Neck ROM: full    Dental no notable dental hx.    Pulmonary neg pulmonary ROS, Current Smoker,    Pulmonary exam normal breath sounds clear to auscultation       Cardiovascular Exercise Tolerance: Good hypertension, negative cardio ROS   Rhythm:regular Rate:Normal     Neuro/Psych negative neurological ROS  negative psych ROS   GI/Hepatic negative GI ROS, Neg liver ROS,   Endo/Other  negative endocrine ROS  Renal/GU negative Renal ROS  negative genitourinary   Musculoskeletal   Abdominal   Peds  Hematology negative hematology ROS (+)   Anesthesia Other Findings   Reproductive/Obstetrics negative OB ROS                             Anesthesia Physical Anesthesia Plan  ASA: 2 and emergent  Anesthesia Plan: General and General ETT   Post-op Pain Management:    Induction:   PONV Risk Score and Plan: Ondansetron  Airway Management Planned:   Additional Equipment:   Intra-op Plan:   Post-operative Plan:   Informed Consent: I have reviewed the patients History and Physical, chart, labs and discussed the procedure including the risks, benefits and alternatives for the proposed anesthesia with the patient or authorized representative who has indicated his/her understanding and acceptance.     Dental Advisory Given  Plan Discussed with: CRNA  Anesthesia Plan Comments:         Anesthesia Quick Evaluation

## 2021-11-20 NOTE — Consult Note (Addendum)
[facility] Consult  Reason for Consult: Acute appendicitis Referring Physician: Dr. Durwin Nora  Chief Complaint   Abdominal Pain     HPI: Tyler Solomon is a 26 y.o. male who presents with an 18-hour history of right lower quadrant abdominal pain.  He woke up at 6 AM with this abdominal pain, and it failed to significantly improve throughout the day.  He did have nausea with episodes of emesis.  He was unable to tolerate any oral intake.  He did take some over-the-counter pain medications to see if this helped, which only minimally improved his pain.  He confirms having history of pain like this in the past, but at that time, he did not present to the hospital.  His past medical history is significant for hypertension.  He denies any history of abdominal surgeries.  He denies use of blood thinning medications.  In the ED, he underwent a CT abdomen and pelvis, which demonstrated acute uncomplicated appendicitis.  He was also noted to have a leukocytosis of 15.9.  Past Medical History:  Diagnosis Date   Hypertension     Past Surgical History:  Procedure Laterality Date   CERVICAL FUSION     WISDOM TOOTH EXTRACTION      Family History  Problem Relation Age of Onset   Healthy Mother    Healthy Father     Social History   Tobacco Use   Smoking status: Every Day    Types: Cigars   Smokeless tobacco: Never  Substance Use Topics   Alcohol use: Yes    Comment: occ   Drug use: No    Medications: I have reviewed the patient's current medications.  Allergies  Allergen Reactions   Bee Venom Swelling     ROS:  Constitutional: negative for chills, fatigue, and fevers Respiratory: negative for shortness of breath Cardiovascular: negative for chest pain Gastrointestinal: positive for abdominal pain, nausea, and vomiting Musculoskeletal:negative for back pain  Blood pressure (!) 150/70, pulse (!) 56, temperature 98.3 F (36.8 C), resp. rate 15, height 6' (1.829  m), weight 106 kg, SpO2 100 %. Physical Exam Vitals reviewed.  Constitutional:      Appearance: He is well-developed.  HENT:     Head: Normocephalic and atraumatic.  Eyes:     Extraocular Movements: Extraocular movements intact.     Pupils: Pupils are equal, round, and reactive to light.  Cardiovascular:     Rate and Rhythm: Normal rate.  Pulmonary:     Effort: Pulmonary effort is normal.  Abdominal:     Comments: Abdomen soft, nondistended, percussion tenderness in the right lower quadrant, tenderness to palpation in the right side of the abdomen, worse in the right lower quadrant; no rigidity, guarding, rebound tenderness; positive Rovsing and McBurney sign  Skin:    General: Skin is warm and dry.  Neurological:     General: No focal deficit present.     Mental Status: He is alert and oriented to person, place, and time.  Psychiatric:        Mood and Affect: Mood normal.        Behavior: Behavior normal.     Results: Results for orders placed or performed during the hospital encounter of 11/19/21 (from the past 48 hour(s))  Lipase, blood     Status: None   Collection Time: 11/19/21  5:39 PM  Result Value Ref Range   Lipase 40 11 - 51 U/L    Comment: Performed at [facility], [address]  [place], [place] [address]  Comprehensive metabolic panel     Status: Abnormal   Collection Time: 11/19/21  5:39 PM  Result Value Ref Range   Sodium 136 135 - 145 mmol/L   Potassium 4.4 3.5 - 5.1 mmol/L   Chloride 101 98 - 111 mmol/L   CO2 26 22 - 32 mmol/L   Glucose, Bld 152 (H) 70 - 99 mg/dL    Comment: Glucose reference range applies only to samples taken after fasting for at least 8 hours.   BUN 13 6 - 20 mg/dL   Creatinine, Ser 8.33 0.61 - 1.24 mg/dL   Calcium 9.7 8.9 - 82.5 mg/dL   Total Protein 8.8 (H) 6.5 - 8.1 g/dL   Albumin 4.4 3.5 - 5.0 g/dL   AST 31 15 - 41 U/L   ALT 42 0 - 44 U/L   Alkaline Phosphatase 105 38 - 126 U/L   Total Bilirubin 0.7 0.3 - 1.2 mg/dL   GFR,  Estimated >05 >39 mL/min    Comment: (NOTE) Calculated using the CKD-EPI Creatinine Equation (2021)    Anion gap 9 5 - 15    Comment: Performed at [facility], [address] [place], [place] [address]  CBC     Status: Abnormal   Collection Time: 11/19/21  5:39 PM  Result Value Ref Range   WBC 15.9 (H) 4.0 - 10.5 K/uL   RBC 5.23 4.22 - 5.81 MIL/uL   Hemoglobin 16.6 13.0 - 17.0 g/dL   HCT 19.3 79.0 - 24.0 %   MCV 92.9 80.0 - 100.0 fL   MCH 31.7 26.0 - 34.0 pg   MCHC 34.2 30.0 - 36.0 g/dL   RDW 97.3 53.2 - 99.2 %   Platelets 259 150 - 400 K/uL   nRBC 0.0 0.0 - 0.2 %    Comment: Performed at [facility], [address] [place], [place] [address]  Urinalysis, Routine w reflex microscopic Urine, Clean Catch     Status: Abnormal   Collection Time: 11/19/21  9:05 PM  Result Value Ref Range   Color, Urine YELLOW YELLOW   APPearance CLEAR CLEAR   Specific Gravity, Urine 1.026 1.005 - 1.030   pH 6.0 5.0 - 8.0   Glucose, UA NEGATIVE NEGATIVE mg/dL   Hgb urine dipstick SMALL (A) NEGATIVE   Bilirubin Urine NEGATIVE NEGATIVE   Ketones, ur NEGATIVE NEGATIVE mg/dL   Protein, ur 419 (A) NEGATIVE mg/dL   Nitrite NEGATIVE NEGATIVE   Leukocytes,Ua NEGATIVE NEGATIVE   RBC / HPF 0-5 0 - 5 RBC/hpf   WBC, UA 0-5 0 - 5 WBC/hpf   Bacteria, UA RARE (A) NONE SEEN   Mucus PRESENT    Hyaline Casts, UA PRESENT    Uric Acid Crys, UA PRESENT     Comment: Performed at [facility], [address] [place], [place] [address]  Comprehensive metabolic panel     Status: Abnormal   Collection Time: 11/20/21  3:35 AM  Result Value Ref Range   Sodium 136 135 - 145 mmol/L   Potassium 3.5 3.5 - 5.1 mmol/L    Comment: DELTA CHECK NOTED   Chloride 104 98 - 111 mmol/L   CO2 26 22 - 32 mmol/L   Glucose, Bld 103 (H) 70 - 99 mg/dL    Comment: Glucose reference range applies only to samples taken after fasting for at least 8 hours.   BUN 12 6 - 20 mg/dL   Creatinine, Ser 7.98 0.61 - 1.24 mg/dL   Calcium 9.2  8.9  - 10.3 mg/dL   Total Protein 7.4 6.5 - 8.1 g/dL   Albumin 3.7 3.5 - 5.0 g/dL   AST 24 15 - 41 U/L   ALT 32 0 - 44 U/L   Alkaline Phosphatase 86 38 - 126 U/L   Total Bilirubin 1.1 0.3 - 1.2 mg/dL   GFR, Estimated >09 >32 mL/min    Comment: (NOTE) Calculated using the CKD-EPI Creatinine Equation (2021)    Anion gap 6 5 - 15    Comment: Performed at [facility], [address] [place], [place] [address]  Magnesium     Status: None   Collection Time: 11/20/21  3:35 AM  Result Value Ref Range   Magnesium 1.7 1.7 - 2.4 mg/dL    Comment: Performed at [facility], [address] [place], [place] [address]  CBC with Differential/Platelet     Status: Abnormal   Collection Time: 11/20/21  3:35 AM  Result Value Ref Range   WBC 13.3 (H) 4.0 - 10.5 K/uL   RBC 4.69 4.22 - 5.81 MIL/uL   Hemoglobin 14.9 13.0 - 17.0 g/dL   HCT 83.3 82.5 - 05.3 %   MCV 92.3 80.0 - 100.0 fL   MCH 31.8 26.0 - 34.0 pg   MCHC 34.4 30.0 - 36.0 g/dL   RDW 97.6 73.4 - 19.3 %   Platelets 238 150 - 400 K/uL   nRBC 0.0 0.0 - 0.2 %   Neutrophils Relative % 65 %   Neutro Abs 8.7 (H) 1.7 - 7.7 K/uL   Lymphocytes Relative 25 %   Lymphs Abs 3.3 0.7 - 4.0 K/uL   Monocytes Relative 8 %   Monocytes Absolute 1.1 (H) 0.1 - 1.0 K/uL   Eosinophils Relative 0 %   Eosinophils Absolute 0.1 0.0 - 0.5 K/uL   Basophils Relative 1 %   Basophils Absolute 0.1 0.0 - 0.1 K/uL   Immature Granulocytes 1 %   Abs Immature Granulocytes 0.06 0.00 - 0.07 K/uL    Comment: Performed at [facility], [address] [place], [place] [address]    CT ABDOMEN PELVIS W CONTRAST  Result Date: 11/19/2021 CLINICAL DATA:  RLQ abdominal pain (Age >= 14y) EXAM: CT ABDOMEN AND PELVIS WITH CONTRAST TECHNIQUE: Multidetector CT imaging of the abdomen and pelvis was performed using the standard protocol following bolus administration of intravenous contrast. RADIATION DOSE REDUCTION: This exam was performed according to the departmental dose-optimization  program which includes automated exposure control, adjustment of the mA and/or kV according to patient size and/or use of iterative reconstruction technique. CONTRAST:  OMNIPAQUE IOHEXOL 300 MG/ML  SOLN COMPARISON:  None Available. FINDINGS: Lower chest: No acute airspace disease or pleural effusion. Hepatobiliary: No focal liver abnormality is seen. No gallstones, gallbladder wall thickening, or biliary dilatation. Pancreas: No ductal dilatation or inflammation. Spleen: Normal in size without focal abnormality. Adrenals/Urinary Tract: Normal adrenal glands. No hydronephrosis, perinephric edema, or renal calculi. No focal renal abnormality. Urinary bladder is essentially empty. Stomach/Bowel: The proximal appendix is dilated at 9 mm with wall thickening. There is moderate periappendiceal fat stranding about the proximal appendix. No perforation or abscess. No appendicolith. There is no other bowel inflammation. No bowel obstruction. Small volume of colonic stool. Vascular/Lymphatic: Normal caliber abdominal aorta. Patent portal and splenic veins. Patent mesenteric vasculature. Circumaortic left renal vein. No suspicious abdominopelvic adenopathy. Reproductive: Prostate is unremarkable. Other: No free air. No free fluid or ascites. No organized collection. Musculoskeletal: There are no acute or suspicious osseous abnormalities.  IMPRESSION: Uncomplicated acute appendicitis. Electronically Signed   By: Narda Rutherford M.D.   On: 11/19/2021 22:27     Assessment & Plan:  Yash Cacciola is a 26 y.o. male who was admitted to the hospital with acute appendicitis.  CT abdomen and pelvis demonstrated uncomplicated acute appendicitis.  Leukocytosis was initially 15.9.  Imaging and blood work evaluated by myself.  -Admission changed to my service -Leukocytosis improved to 13.3 from 15.9 with IV antibiotics -Continue IV Zosyn -NPO -IV fluids -Discussed the risk of laparoscopic appendectomy and the option of  antibiotics alone. Discussed that in [place] and some trials in the [place], antibiotics are used for simple appendicitis. Discussed that research shows a 40% failure rate for antibiotics alone.  Discussed risk of surgery including but not limited to bleeding, infection, injury to other organs, normal appendix, and after this discussion the patient has decided to proceed with laparoscopic appendectomy. -Patient tentatively scheduled for laparoscopic appendectomy this morning -I discussed with the patient that he will likely be stable for discharge home later today if he is able to tolerate a diet and his pain is adequately controlled  All questions were answered to the satisfaction of the patient.  -- Theophilus Kinds, DO [facility] [address] [place], [place] [address] [phone] (office)

## 2021-11-20 NOTE — Progress Notes (Signed)
Tyler Solomon had surgery on 11/20/2021. He may return to work on 11/24/2021. Pt is to not lift anything over 10 lbs for two weeks. Please call for any further questions.    Ulanda Edison RN [facility]

## 2021-11-20 NOTE — Transfer of Care (Signed)
Immediate Anesthesia Transfer of Care Note  Patient: Tyler Solomon  Procedure(s) Performed: APPENDECTOMY LAPAROSCOPIC (Abdomen)  Patient Location: PACU  Anesthesia Type:General  Level of Consciousness: awake, alert , oriented and patient cooperative  Airway & Oxygen Therapy: Patient Spontanous Breathing and Patient connected to face mask oxygen  Post-op Assessment: Report given to RN, Post -op Vital signs reviewed and stable and Patient moving all extremities  Post vital signs: Reviewed and stable  Last Vitals:  Vitals Value Taken Time  BP 171/77 11/20/21 1110  Temp    Pulse 91 11/20/21 1113  Resp 24 11/20/21 1113  SpO2 99 % 11/20/21 1113  Vitals shown include unvalidated device data.  Last Pain:  Vitals:   11/20/21 0730  TempSrc:   PainSc: 4          Complications:  Encounter Notable Events  Notable Event Outcome Phase Comment  Difficult to intubate - expected  Intraprocedure Filed from anesthesia note documentation.

## 2021-11-20 NOTE — Progress Notes (Signed)
Update note:  Spoke with the patient's girlfriend, Guinevere Ferrari in the consultation room.  I explained that he tolerated his procedure without difficulty.  He did have some chronic inflammatory changes of his appendix, but we were able to remove it.  He will likely be stable for discharge home later today assuming his pain is adequately controlled and he able to tolerate a diet without issue.  He has dissolvable stitches under the skin and overlying skin glue, which will flake off in 10 to 14 days.  He will be discharged home with a narcotic pain medication that he should take as needed for pain.  He should also take a stool softener while taking narcotic pain medications.  I also recommend that he take scheduled Tylenol for the next 3 to 5 days to decrease inflammation.  All questions were answered to her expressed satisfaction.  Spoke with the patient in PACU.  I explained that he tolerated this procedure without issue.  A picture of his appendix is in his chart.  If he is able to tolerate a diet and pain is adequately controlled, will plan for discharge home later today.  We will administer his 2PM dose of Zosyn now, and plan to not discharge him with any further antibiotics.  All questions were answered to his expressed satisfaction.  Theophilus Kinds, DO [facility] [address] [place], [place] [address] [phone] (office)

## 2021-11-20 NOTE — ED Notes (Signed)
Pt gone to OR 

## 2021-11-20 NOTE — Progress Notes (Signed)
  Transition of Care [facility]) Screening Note   Patient Details  Name: Tyler Solomon Date of Birth: February 02, 1996   Transition of [facility]) CM/SW Contact:    Villa Herb, LCSWA Phone Number: 11/20/2021, 1:21 PM    Transition of Care Department [facility]) has reviewed patient and no TOC needs have been identified at this time. We will continue to monitor patient advancement through interdisciplinary progression rounds. If new patient transition needs arise, please place a TOC consult.

## 2021-11-20 NOTE — Assessment & Plan Note (Signed)
-  as seen on CT -Gen surg to take over care today -Continue zosyn -Plan for surgery today -Pain control with pain scale -NPO -Continue to monitor

## 2021-11-20 NOTE — Discharge Instructions (Signed)
Ambulatory Surgery Discharge Instructions  General Anesthesia or Sedation Do not drive or operate heavy machinery for 24 hours.  Do not consume alcohol, tranquilizers, sleeping medications, or any non-prescribed medications for 24 hours. Do not make important decisions or sign any important papers in the next 24 hours. You should have someone with you tonight at home.  Activity  You are advised to go directly home from the hospital.  Restrict your activities and rest for a day.  Resume light activity tomorrow. No heavy lifting over 10 lbs or strenuous exercise.  Fluids and Diet Begin with clear liquids, bouillon, dry toast, soda crackers.  If not nauseated, you may go to a regular diet when you desire.  Greasy and spicy foods are not advised.  Medications  If you have not had a bowel movement in 24 hours, take 2 tablespoons over the counter Milk of mag.             You May resume your blood thinners tomorrow (Aspirin, coumadin, or other).  You are being discharged with prescriptions for Opioid/Narcotic Medications: There are some specific considerations for these medications that you should know. Opioid Meds have risks & benefits. Addiction to these meds is always a concern with prolonged use Take medication only as directed Do not drive while taking narcotic pain medication Do not crush tablets or capsules Do not use a different container than medication was dispensed in Lock the container of medication in a cool, dry place out of reach of children and pets. Opioid medication can cause addiction Do not share with anyone else (this is a felony) Do not store medications for future use. Dispose of them properly.     Disposal:  Find a [facility] household drug take back site near you.  If you can't get to a drug take back site, use the recipe below as a last resort to dispose of expired, unused or unwanted drugs. Disposal  (Do not dispose chemotherapy drugs this way, talk to your  prescribing doctor instead.) Step 1: Mix drugs (do not crush) with dirt, kitty litter, or used coffee grounds and add a small amount of water to dissolve any solid medications. Step 2: Seal drugs in plastic bag. Step 3: Place plastic bag in trash. Step 4: Take prescription container and scratch out personal information, then recycle or throw away.  Operative Site  You have a liquid bandage over your incisions, this will begin to flake off in about a week. Ok to shower tomorrow. Keep wound clean and dry. No baths or swimming. No lifting more than 10 pounds.  Contact Information: If you have questions or concerns, please call our office, [phone], Monday- Thursday 8AM-5PM and Friday 8AM-12Noon.  If it is after hours or on the weekend, please call [facility] Main Number, [phone], and ask to speak to the surgeon on call for Dr. Nyella Eckels at [place].   SPECIFIC COMPLICATIONS TO WATCH FOR: Inability to urinate Fever over 101? F by mouth Nausea and vomiting lasting longer than 24 hours. Pain not relieved by medication ordered Swelling around the operative site Increased redness, warmth, hardness, around operative area Numbness, tingling, or cold fingers or toes Blood -soaked dressing, (small amounts of oozing may be normal) Increasing and progressive drainage from surgical area or exam site  

## 2021-11-20 NOTE — H&P (Signed)
History and Physical    Patient: Tyler Solomon TZG:[id number] DOB: [birth year] DOA: 11/19/2021 DOS: the patient was seen and examined on 11/20/2021 PCP: Shaune Pollack, MD (Inactive)  Patient coming from: Home  Chief Complaint:  Chief Complaint  Patient presents with   Abdominal Pain   HPI: Tyler Solomon is a 25 y.o. male with medical history significant of tobacco use disorder and HTN - not taking any meds- presents with abdominal pain. The pain is in the right lower quadrant. It started Monday morning. It feels sharp and stabbing. He has had associated nausea, and vomited for about 2 hours. The emesis was non bloody. He reports associated myalgias, subjective fever, chills. Last BM was Monday. Last meal was Sunday night. Patient reports no other complaints. He did not take medication prior to arrival for fever or pain.   Review of Systems: As mentioned in the history of present illness. All other systems reviewed and are negative. Past Medical History:  Diagnosis Date   Hypertension    Past Surgical History:  Procedure Laterality Date   CERVICAL FUSION     WISDOM TOOTH EXTRACTION     Social History:  reports that he has been smoking cigars. He has never used smokeless tobacco. He reports current alcohol use. He reports that he does not use drugs.  Allergies  Allergen Reactions   Bee Venom Swelling    Family History  Problem Relation Age of Onset   Healthy Mother    Healthy Father     Prior to Admission medications   Medication Sig Start Date End Date Taking? Authorizing Provider  ibuprofen (ADVIL) 200 MG tablet Take 200 mg by mouth every 6 (six) hours as needed for moderate pain, headache or fever.   Yes [provider]  amLODipine (NORVASC) 10 MG tablet Take 1 tablet by mouth daily. Patient not taking: Reported on 11/19/2021 03/23/12   [provider]  cyclobenzaprine (FLEXERIL) 10 MG tablet Take 1 tablet (10 mg total) by mouth 2 (two) times daily as needed  for muscle spasms. Patient not taking: Reported on 11/19/2021 10/06/20   Alvino Chapel, [place], PA-C  predniSONE (DELTASONE) 20 MG tablet Take 2 tablets (40 mg total) by mouth daily with breakfast. Patient not taking: Reported on 11/19/2021 08/28/21   Particia Nearing, PA-C  predniSONE (STERAPRED UNI-PAK 21 TAB) 10 MG (21) TBPK tablet Take by mouth daily. Take 6 tabs by mouth daily  for 2 days, then 5 tabs for 2 days, then 4 tabs for 2 days, then 3 tabs for 2 days, 2 tabs for 2 days, then 1 tab by mouth daily for 2 days Patient not taking: Reported on 11/19/2021 10/06/20   Rennis Harding, PA-C    Physical Exam: Vitals:   11/19/21 2006 11/19/21 2030 11/20/21 0515 11/20/21 0523  BP: (!) 140/57 137/80 128/73   Pulse: 60 (!) 57 61   Resp: 18 14 18    Temp:    98.1 F (36.7 C)  TempSrc:      SpO2: 100% 100% 100%   Weight:      Height:       1.  General: Patient lying supine in bed,  no acute distress   2. Psychiatric: Alert and oriented x 3, mood and behavior normal for situation, pleasant and cooperative with exam   3. Neurologic: Speech and language are normal, face is symmetric, moves all 4 extremities voluntarily, at baseline without acute deficits on limited exam   4. HEENMT:  Head is atraumatic, normocephalic, pupils  reactive to light, neck is supple, trachea is midline, mucous membranes are moist   5. Respiratory : Lungs are clear to auscultation bilaterally without wheezing, rhonchi, rales, no cyanosis, no increase in work of breathing or accessory muscle use   6. Cardiovascular : Heart rate normal, rhythm is regular, no murmurs, rubs or gallops, no peripheral edema, peripheral pulses palpated   7. Gastrointestinal:  Abdomen is soft, nondistended, tender to palpation on the right lower quadrant, + tenderness at McBurney's point, no rebound tenderness  8. Skin:  Skin is warm, dry and intact without rashes, acute lesions, or ulcers on limited exam   9.Musculoskeletal:  No acute  deformities or trauma, no asymmetry in tone, no peripheral edema, peripheral pulses palpated, no tenderness to palpation in the extremities  Data Reviewed: Vital signs stable Leukocytosis 15.9 UA shows proteinuria - likely related to untreated HTN LR 1 L given LR 153ml/hr continued Morphine, zofran, zosyn given Gen surg consulted and will see patient this AM  Assessment and Plan: * Appendicitis -as seen on CT -Gen surg to take over care today -Continue zosyn -Plan for surgery today -Pain control with pain scale -NPO -Continue to monitor   Leukocytosis -2/2 appendicitis -improving on zosyn  HTN (hypertension) -not currently on medication per med rec  Tobacco use disorder -Advised to quit -Declining nicotine patch at this time      Advance Care Planning:   Code Status: Full Code   Consults: gen surg  Family Communication: none at bedside  Severity of Illness: The appropriate patient status for this patient is INPATIENT. Inpatient status is judged to be reasonable and necessary in order to provide the required intensity of service to ensure the patient's safety. The patient's presenting symptoms, physical exam findings, and initial radiographic and laboratory data in the context of their chronic comorbidities is felt to place them at high risk for further clinical deterioration. Furthermore, it is not anticipated that the patient will be medically stable for discharge from the hospital within 2 midnights of admission.   * I certify that at the point of admission it is my clinical judgment that the patient will require inpatient hospital care spanning beyond 2 midnights from the point of admission due to high intensity of service, high risk for further deterioration and high frequency of surveillance required.*  Author: Lilyan Gilford, DO 11/20/2021 6:41 AM  For on call review [email or web address].

## 2021-11-20 NOTE — Assessment & Plan Note (Signed)
-  2/2 appendicitis -improving on zosyn

## 2021-11-20 NOTE — Assessment & Plan Note (Signed)
-  Advised to quit -Declining nicotine patch at this time

## 2021-11-20 NOTE — ED Notes (Signed)
Per DR Pratik, hold heparin SQ injection, due to pending surgery

## 2021-11-20 NOTE — Assessment & Plan Note (Signed)
-  not currently on medication per med rec

## 2021-11-20 NOTE — Discharge Summary (Signed)
Physician Discharge Summary  Patient ID: Tyler Solomon MRN: [id number] DOB/AGE: 1995/07/19 26 y.o.  Admit date: 11/19/2021 Discharge date: 11/20/2021  Admission Diagnoses:  Acute Appendicitis  Discharge Diagnoses:  Principal Problem:   Appendicitis Active Problems:   Tobacco use disorder   HTN (hypertension)   Leukocytosis   Discharged Condition: stable  Hospital Course: Patient is a 26 year old male who was admitted overnight with CT findings concerning for acute appendicitis.  He underwent laparoscopic appendectomy without issue.  Postoperatively, he was able to tolerate a diet and pain was adequately controlled with oral pain medications.  He is stable for discharge home.  He will be discharged home with prescription for Roxicodone.  He will have a phone follow up with me next week.  Consults: general surgery  Significant Diagnostic Studies: radiology: CT scan: acute appendicitis  Treatments: IV hydration, antibiotics: Zosyn, and surgery: laparoscopic appendectomy  Discharge Exam: Blood pressure (!) 146/76, pulse 67, temperature 98.2 F (36.8 C), temperature source Oral, resp. rate 18, height 6' (1.829 m), weight 106 kg, SpO2 98 %. General appearance: alert, cooperative, and no distress GI: Abdomen soft, nondistended, no percussion tenderness, appropriate incisional TTP, no rigidity, guarding or rebound tenderness  Disposition: Discharge disposition: 01-Home or Self Care       Discharge Instructions     Call MD for:  persistant nausea and vomiting   Complete by: As directed    Call MD for:  redness, tenderness, or signs of infection (pain, swelling, redness, odor or green/yellow discharge around incision site)   Complete by: As directed    Call MD for:  severe uncontrolled pain   Complete by: As directed    Call MD for:  temperature >100.4   Complete by: As directed    Diet - low sodium heart healthy   Complete by: As directed    Increase activity slowly    Complete by: As directed       Allergies as of 11/20/2021       Reactions   Bee Venom Swelling        Medication List     TAKE these medications    acetaminophen 500 MG tablet Commonly known as: TYLENOL Take 2 tablets (1,000 mg total) by mouth every 6 (six) hours for 7 days.   amLODipine 10 MG tablet Commonly known as: NORVASC Take 1 tablet by mouth daily.   cyclobenzaprine 10 MG tablet Commonly known as: FLEXERIL Take 1 tablet (10 mg total) by mouth 2 (two) times daily as needed for muscle spasms.   docusate sodium 100 MG capsule Commonly known as: Colace Take 1 capsule (100 mg total) by mouth 2 (two) times daily.   ibuprofen 200 MG tablet Commonly known as: ADVIL Take 200 mg by mouth every 6 (six) hours as needed for moderate pain, headache or fever.   oxyCODONE 5 MG immediate release tablet Commonly known as: Roxicodone Take 1 tablet (5 mg total) by mouth every 6 (six) hours as needed for up to 7 days.   predniSONE 10 MG (21) Tbpk tablet Commonly known as: STERAPRED UNI-PAK 21 TAB Take by mouth daily. Take 6 tabs by mouth daily  for 2 days, then 5 tabs for 2 days, then 4 tabs for 2 days, then 3 tabs for 2 days, 2 tabs for 2 days, then 1 tab by mouth daily for 2 days   predniSONE 20 MG tablet Commonly known as: DELTASONE Take 2 tablets (40 mg total) by mouth daily with breakfast.  Follow-up Information     Lillian Ballester, Gustavus Messing, DO. Call.   Specialty: General Surgery Why: Call to schedule a follow up appointment with me in 2 weeks Contact information: [address] [place] [facility] [address] [phone]                 Signed: Santina Evans A Mayela Bullard 11/20/2021, 11:29 PM

## 2021-11-20 NOTE — Anesthesia Procedure Notes (Signed)
Procedure Name: Intubation Date/Time: 11/20/2021 9:09 AM  Performed by: Lucinda Dell, CRNAPre-anesthesia Checklist: Patient identified, Emergency Drugs available, Suction available and Patient being monitored Patient Re-evaluated:Patient Re-evaluated prior to induction Oxygen Delivery Method: Circle system utilized Preoxygenation: Pre-oxygenation with 100% oxygen Induction Type: IV induction Ventilation: Mask ventilation without difficulty Laryngoscope Size: Glidescope and 3 Tube type: Oral Tube size: 7.5 mm Number of attempts: 1 Airway Equipment and Method: Video-laryngoscopy and Stylet Placement Confirmation: ETT inserted through vocal cords under direct vision, positive ETCO2 and breath sounds checked- equal and bilateral Secured at: 22 cm Tube secured with: Tape Dental Injury: Teeth and Oropharynx as per pre-operative assessment  Difficulty Due To: Difficulty was anticipated, Difficult Airway- due to anterior larynx and Difficult Airway- due to limited oral opening Comments: Easy mask airway. Glidescope used d/t MPIII and anticipated difficult airway. Good view with Glide. Atraumatic intubation.

## 2021-11-20 NOTE — Op Note (Signed)
[facility]  Date of Surgery: 11/20/2021  Admit Date: 11/19/2021   Performing Service: General  Surgeon(s) and Role:    * Lani Havlik, Gustavus Messing, DO - Primary   Pre-operative Diagnosis: Acute Appendicitis  Post-operative Diagnosis: Acute Appendicitis  Procedure Performed: Laparoscopic Appendectomy   Surgeon: Theophilus Kinds, DO   Assistant: Nelia Shi, MS3  Anesthesia: General   Findings:  The appendix was found to be inflamed. There were not signs of necrosis. There was not perforation. There was not abscess formation.   Estimated Blood Loss: less than 50 mL   Specimens:  ID Type Source Tests Collected by Time Destination  1 : Appendix Tissue PATH Appendix SURGICAL PATHOLOGY Tyler Solomon, Gustavus Messing, DO 11/20/2021 0843      Complications: None; patient tolerated the procedure well.   Disposition: PACU - hemodynamically stable.   Condition: stable   Indications: The patient presented with a 1 day history of right-sided abdominal pain. A CT abdomen and pelvis revealed findings consistent with acute appendicitis.   Procedure Details  Prior to the procedure, the risks, benefits, complications, treatment options, and expected outcomes were discussed with the patient and/or family, including but not limited to the risk of bleeding, infection, finding of a normal appendix, and the need for conversion to an open procedure. There was concurrence with the proposed plan and informed consent was obtained. The patient was taken to the operating room, identified as Tyler Solomon and the procedure verified as Laproscopic Appendectomy.   The patient was placed in the supine position and general anesthesia was induced, along with placement of orogastric tube, SCD's, and a Foley catheter. The abdomen was prepped and draped in a sterile fashion. The abdomen was entered with Veress technique in the infraumbilical incision. Intraperitoneal placement was confirmed  with saline drop, low entry pressures, and easy insufflation. A 11 mm optiview trocar was placed under direct visualization with a 0 degree scope. The 10 mm 0 degree scope was placed in the abdomen and no evidence of injury was identified. A 12 mm port was placed in the left lower quadrant of the abdomen after skin incision with trocar placement under direct vision. A careful evaluation of the entire abdomen was carried out. An additional 5 mm port was placed in the suprapubic area under direct vision.  The patient was placed in Trendelenburg and left lateral decubitus position. The small intestines were retracted in the cephalad and left lateral direction away from the pelvis and right lower quadrant. The patient was found to have an acutely and chronically inflamed appendix. There was not evidence of perforation.   The appendix was carefully dissected. The mesoappendix was taken with the harmonic energy device until the base of the appendix entering the cecum was visualized. The appendix was divided at its base using another tan endo-GIA stapler. The staple line was inspected and noted to be within normal limits. The appendix was placed within an Endocatch specimen bag and was removed from the abdomen. The appendix was passed off the field as a specimen. There was no evidence of bleeding, leakage, or complication after division of the appendix.  Any remaining blood was suctioned out from the abdomen, hemostasis was confirmed. Surgical SNOW was placed along the staple line and dissection bed. The ports were removed under direct visualization.  The the 12 mm and 10 mm port sites were closed with a 0 Vicryl suture. The trocar site skin wounds were closed using subcuticular 4-0 Monocryl suture and dermabond. The patient was then  awakened from general anesthesia, extubated, and taken to PACU for recovery.   Instrument, sponge, and needle counts were correct at the conclusion of the case.   Theophilus Kinds, DO [facility] [address] [place], [place] [address] [phone]([id number])

## 2021-11-21 ENCOUNTER — Other Ambulatory Visit: Payer: Self-pay

## 2021-11-21 NOTE — Anesthesia Postprocedure Evaluation (Signed)
Anesthesia Post Note  Patient: Tyler Solomon  Procedure(s) Performed: APPENDECTOMY LAPAROSCOPIC (Abdomen)  Patient location during evaluation: Phase II Anesthesia Type: General Level of consciousness: awake Pain management: pain level controlled Vital Signs Assessment: post-procedure vital signs reviewed and stable Respiratory status: spontaneous breathing and respiratory function stable Cardiovascular status: blood pressure returned to baseline and stable Postop Assessment: no headache and no apparent nausea or vomiting Anesthetic complications: yes Comments: Late entry   Encounter Notable Events  Notable Event Outcome Phase Comment  Difficult to intubate - expected  Intraprocedure Filed from anesthesia note documentation.     Last Vitals:  Vitals:   11/20/21 1230 11/20/21 1414  BP: 127/67 (!) 146/76  Pulse: 64 67  Resp: 19 18  Temp:  36.8 C  SpO2: 96% 98%    Last Pain:  Vitals:   11/20/21 1414  TempSrc: Oral  PainSc: 7                  Windell Norfolk

## 2021-11-22 LAB — SURGICAL PATHOLOGY

## 2021-11-26 ENCOUNTER — Encounter (HOSPITAL_COMMUNITY): Payer: Self-pay | Admitting: Surgery

## 2021-11-29 ENCOUNTER — Ambulatory Visit (INDEPENDENT_AMBULATORY_CARE_PROVIDER_SITE_OTHER): Payer: Self-pay | Admitting: Surgery

## 2021-11-29 DIAGNOSIS — Z09 Encounter for follow-up examination after completed treatment for conditions other than malignant neoplasm: Secondary | ICD-10-CM

## 2021-12-10 NOTE — Progress Notes (Signed)
Attempted to call the patient twice via the listed phone number.  The call was unable to be completed, and I was unable to leave a voicemail.  I subsequently called his mother, and left a voicemail to call the office as needed.  Pathology: A. APPENDIX, APPENDECTOMY:  - Acute appendicitis.   Theophilus Kinds, DO [facility] [address] [place], [place] [address] [phone] (office)

## 2022-05-17 ENCOUNTER — Ambulatory Visit
Admission: RE | Admit: 2022-05-17 | Discharge: 2022-05-17 | Disposition: A | Discharge status: Home / Self Care | Payer: BC Managed Care – PPO | Source: Ambulatory Visit | Attending: Nurse Practitioner | Admitting: Nurse Practitioner

## 2022-05-17 ENCOUNTER — Other Ambulatory Visit: Payer: Self-pay

## 2022-05-17 VITALS — BP 158/97 | HR 75 | Temp 98.5°F | Resp 20

## 2022-05-17 DIAGNOSIS — Z113 Encounter for screening for infections with a predominantly sexual mode of transmission: Secondary | ICD-10-CM | POA: Diagnosis present

## 2022-05-17 NOTE — ED Triage Notes (Signed)
Pt reports is here for yearly check for STD's. Pt denies any known exposures.

## 2022-05-17 NOTE — Discharge Instructions (Addendum)
Your results should be available within the next 48 to 72 hours.  If you have access to MyChart, you will be able to see the results there.  If your results are positive, you will be contacted to discuss treatment. Recommend condom use with each sexual encounter. Follow-up as needed.

## 2022-05-17 NOTE — ED Provider Notes (Signed)
[facility]    CSN: [id number] Arrival date & time: 05/17/22  1653      History   Chief Complaint Chief Complaint  Patient presents with   Exposure to STD    Want todo a sex screening - Entered by patient    HPI Tyler Solomon is a 27 y.o. male.   The history is provided by the patient.   The patient presents for STI testing.  Patient denies penile discharge, abdominal pain, urinary frequency, urgency, hematuria, testicular/scrotal pain/swelling.  Patient reports 1 male partner within the past 90 days.  He reports that he does not have a history of STI/STD.  Past Medical History:  Diagnosis Date   Hypertension     Patient Active Problem List   Diagnosis Date Noted   Tobacco use disorder 11/20/2021   HTN (hypertension) 11/20/2021   Leukocytosis 11/20/2021   Appendicitis 11/19/2021    Past Surgical History:  Procedure Laterality Date   CERVICAL FUSION     LAPAROSCOPIC APPENDECTOMY N/A 11/20/2021   Procedure: APPENDECTOMY LAPAROSCOPIC;  Surgeon: Rusty Aus, DO;  Location: [facility];  Service: General;  Laterality: N/A;   WISDOM TOOTH EXTRACTION         Home Medications    Prior to Admission medications   Medication Sig Start Date End Date Taking? Authorizing Provider  amLODipine (NORVASC) 10 MG tablet Take 1 tablet by mouth daily. Patient not taking: Reported on 11/19/2021 03/23/12   [provider]  cyclobenzaprine (FLEXERIL) 10 MG tablet Take 1 tablet (10 mg total) by mouth 2 (two) times daily as needed for muscle spasms. Patient not taking: Reported on 11/19/2021 10/06/20   Stacey Drain, [place], PA-C  docusate sodium (COLACE) 100 MG capsule Take 1 capsule (100 mg total) by mouth 2 (two) times daily. 11/20/21 11/20/22  Pappayliou, Barnetta Chapel A, DO  ibuprofen (ADVIL) 200 MG tablet Take 200 mg by mouth every 6 (six) hours as needed for moderate pain, headache or fever.    [provider]  predniSONE (DELTASONE) 20 MG tablet Take 2  tablets (40 mg total) by mouth daily with breakfast. Patient not taking: Reported on 11/19/2021 08/28/21   Volney American, PA-C  predniSONE (STERAPRED UNI-PAK 21 TAB) 10 MG (21) TBPK tablet Take by mouth daily. Take 6 tabs by mouth daily  for 2 days, then 5 tabs for 2 days, then 4 tabs for 2 days, then 3 tabs for 2 days, 2 tabs for 2 days, then 1 tab by mouth daily for 2 days Patient not taking: Reported on 11/19/2021 10/06/20   Lestine Box, PA-C    Family History Family History  Problem Relation Age of Onset   Healthy Mother    Healthy Father     Social History Social History   Tobacco Use   Smoking status: Every Day    Types: Cigars   Smokeless tobacco: Never  Substance Use Topics   Alcohol use: Yes    Comment: occ   Drug use: No     Allergies   Bee venom   Review of Systems Review of Systems Per HPI  Physical Exam Triage Vital Signs ED Triage Vitals  Enc Vitals Group     BP 05/17/22 1732 (!) 158/97     Pulse Rate 05/17/22 1732 75     Resp 05/17/22 1732 20     Temp 05/17/22 1732 98.5 F (36.9 C)     Temp Source 05/17/22 1732 Oral     SpO2 05/17/22 1732 98 %  Weight --      Height --      Head Circumference --      Peak Flow --      Pain Score 05/17/22 1735 0     Pain Loc --      Pain Edu? --      Excl. in [place]? --    No data found.  Updated Vital Signs BP (!) 158/97 (BP Location: Right Arm)   Pulse 75   Temp 98.5 F (36.9 C) (Oral)   Resp 20   SpO2 98%   Visual Acuity Right Eye Distance:   Left Eye Distance:   Bilateral Distance:    Right Eye Near:   Left Eye Near:    Bilateral Near:     Physical Exam Vitals and nursing note reviewed.  Constitutional:      General: He is not in acute distress.    Appearance: He is well-developed.  HENT:     Head: Normocephalic and atraumatic.  Eyes:     Extraocular Movements: Extraocular movements intact.     Pupils: Pupils are equal, round, and reactive to light.  Cardiovascular:     Rate  and Rhythm: Normal rate and regular rhythm.     Heart sounds: No murmur heard. Pulmonary:     Effort: Pulmonary effort is normal. No respiratory distress.     Breath sounds: Normal breath sounds.  Abdominal:     General: Bowel sounds are normal.     Palpations: Abdomen is soft.     Tenderness: There is no abdominal tenderness.  Musculoskeletal:        General: No swelling.     Cervical back: Normal range of motion.  Skin:    General: Skin is warm and dry.     Capillary Refill: Capillary refill takes less than 2 seconds.  Neurological:     Mental Status: He is alert.  Psychiatric:        Mood and Affect: Mood normal.        Behavior: Behavior normal.      UC Treatments / Results  Labs (all labs ordered are listed, but only abnormal results are displayed) Labs Reviewed  CYTOLOGY, (ORAL, ANAL, URETHRAL) ANCILLARY ONLY    EKG   Radiology No results found.  Procedures Procedures (including critical care time)  Medications Ordered in UC Medications - No data to display  Initial Impression / Assessment and Plan / UC Course  I have reviewed the triage vital signs and the nursing notes.  Pertinent labs & imaging results that were available during my care of the patient were reviewed by me and considered in my medical decision making (see chart for details).  The patient is well-appearing, he is in no acute distress, he is hypertensive, but vital signs are otherwise stable.  Patient presents for screening for sexually transmitted disease.  Cytology swab is pending, patient declined HIV/RPR testing today.  Patient advised he will be contacted if the pending test results are positive.  Recommended condom use with each sexual encounter.  Patient advised to follow-up as needed.   Final Clinical Impressions(s) / UC Diagnoses   Final diagnoses:  Screening examination for sexually transmitted disease     Discharge Instructions      Your results should be available within  the next 48 to 72 hours.  If you have access to MyChart, you will be able to see the results there.  If your results are positive, you will be contacted to discuss  treatment. Recommend condom use with each sexual encounter. Follow-up as needed.      ED Prescriptions   None    PDMP not reviewed this encounter.   Tish Men, NP 05/17/22 1755

## 2022-05-20 LAB — CYTOLOGY, (ORAL, ANAL, URETHRAL) ANCILLARY ONLY
Chlamydia: NEGATIVE
Comment: NEGATIVE
Comment: NEGATIVE
Comment: NORMAL
Neisseria Gonorrhea: NEGATIVE
Trichomonas: NEGATIVE

## 2022-06-05 ENCOUNTER — Ambulatory Visit: Payer: BC Managed Care – PPO

## 2022-06-08 ENCOUNTER — Ambulatory Visit: Payer: BC Managed Care – PPO

## 2022-06-11 ENCOUNTER — Other Ambulatory Visit: Payer: Self-pay

## 2022-06-11 ENCOUNTER — Ambulatory Visit
Admission: RE | Admit: 2022-06-11 | Discharge: 2022-06-11 | Disposition: A | Discharge status: Home / Self Care | Payer: BC Managed Care – PPO | Source: Ambulatory Visit | Attending: Nurse Practitioner | Admitting: Nurse Practitioner

## 2022-06-11 VITALS — BP 149/94 | HR 69 | Temp 98.2°F | Resp 20

## 2022-06-11 DIAGNOSIS — Z113 Encounter for screening for infections with a predominantly sexual mode of transmission: Secondary | ICD-10-CM | POA: Diagnosis not present

## 2022-06-11 NOTE — ED Triage Notes (Signed)
Pt reports was seen for same in Feb when exposed to chlamydia and reports former partner accused pt of having a false negative. Pt inquiring about urine test. Pt aware UC does not perform urine test. Pt requesting re-swab.

## 2022-06-11 NOTE — ED Provider Notes (Signed)
[facility]    CSN: [id number] Arrival date & time: 06/11/22  1021      History   Chief Complaint Chief Complaint  Patient presents with   SEXUALLY TRANSMITTED DISEASE    Need a urine test done - Entered by patient    HPI Tyler Solomon is a 27 y.o. male.   Patient presents today for STI screening.  Reports approximately 1 month ago, a sexual partner tested positive for chlamydia.  He was seen in urgent care and tested negative for gonorrhea, chlamydia, and trichomonas shortly thereafter.  Reports the same sexual partner has been adamant that he is the one who gave her chlamydia.  He is requesting retesting today so he can inform that sexual partner.  He denies any symptoms including no fever, body aches, chills, urethral discharge, dysuria, urinary frequency or urgency, abdominal pain, nausea/vomiting, penile sores or lesions, scrotal or testicular pain, and groin swelling.   Reports his sexual partner told him that a provider she saw told her that her sexual partners test had to be a false negative and that he should have his urine checked.  Patient is requesting urine testing today.    Past Medical History:  Diagnosis Date   Hypertension     Patient Active Problem List   Diagnosis Date Noted   Tobacco use disorder 11/20/2021   HTN (hypertension) 11/20/2021   Leukocytosis 11/20/2021   Appendicitis 11/19/2021    Past Surgical History:  Procedure Laterality Date   CERVICAL FUSION     LAPAROSCOPIC APPENDECTOMY N/A 11/20/2021   Procedure: APPENDECTOMY LAPAROSCOPIC;  Surgeon: Rusty Aus, DO;  Location: [facility];  Service: General;  Laterality: N/A;   WISDOM TOOTH EXTRACTION         Home Medications    Prior to Admission medications   Medication Sig Start Date End Date Taking? Authorizing Provider  amLODipine (NORVASC) 10 MG tablet Take 1 tablet by mouth daily. Patient not taking: Reported on 11/19/2021 03/23/12   [provider]   cyclobenzaprine (FLEXERIL) 10 MG tablet Take 1 tablet (10 mg total) by mouth 2 (two) times daily as needed for muscle spasms. Patient not taking: Reported on 11/19/2021 10/06/20   Stacey Drain, [place], PA-C  docusate sodium (COLACE) 100 MG capsule Take 1 capsule (100 mg total) by mouth 2 (two) times daily. 11/20/21 11/20/22  Pappayliou, Barnetta Chapel A, DO  ibuprofen (ADVIL) 200 MG tablet Take 200 mg by mouth every 6 (six) hours as needed for moderate pain, headache or fever.    [provider]  predniSONE (DELTASONE) 20 MG tablet Take 2 tablets (40 mg total) by mouth daily with breakfast. Patient not taking: Reported on 11/19/2021 08/28/21   Volney American, PA-C  predniSONE (STERAPRED UNI-PAK 21 TAB) 10 MG (21) TBPK tablet Take by mouth daily. Take 6 tabs by mouth daily  for 2 days, then 5 tabs for 2 days, then 4 tabs for 2 days, then 3 tabs for 2 days, 2 tabs for 2 days, then 1 tab by mouth daily for 2 days Patient not taking: Reported on 11/19/2021 10/06/20   Lestine Box, PA-C    Family History Family History  Problem Relation Age of Onset   Healthy Mother    Healthy Father     Social History Social History   Tobacco Use   Smoking status: Every Day    Types: Cigars   Smokeless tobacco: Never  Substance Use Topics   Alcohol use: Yes    Comment: occ  Drug use: No     Allergies   Bee venom   Review of Systems Review of Systems Per HPI  Physical Exam Triage Vital Signs ED Triage Vitals  Enc Vitals Group     BP 06/11/22 1052 (!) 149/94     Pulse Rate 06/11/22 1052 69     Resp 06/11/22 1052 20     Temp 06/11/22 1052 98.2 F (36.8 C)     Temp Source 06/11/22 1052 Oral     SpO2 06/11/22 1052 99 %     Weight --      Height --      Head Circumference --      Peak Flow --      Pain Score 06/11/22 1050 0     Pain Loc --      Pain Edu? --      Excl. in [place]? --    No data found.  Updated Vital Signs BP (!) 149/94 (BP Location: Right Arm)   Pulse 69   Temp 98.2  F (36.8 C) (Oral)   Resp 20   SpO2 99%   Visual Acuity Right Eye Distance:   Left Eye Distance:   Bilateral Distance:    Right Eye Near:   Left Eye Near:    Bilateral Near:     Physical Exam Vitals and nursing note reviewed.  Constitutional:      General: He is not in acute distress.    Appearance: Normal appearance. He is not toxic-appearing.  Pulmonary:     Effort: Pulmonary effort is normal. No respiratory distress.  Genitourinary:    Comments: Deferred - self swab performed Skin:    General: Skin is warm and dry.     Capillary Refill: Capillary refill takes less than 2 seconds.     Coloration: Skin is not jaundiced or pale.  Neurological:     Mental Status: He is alert and oriented to person, place, and time.     Motor: No weakness.     Gait: Gait normal.  Psychiatric:        Behavior: Behavior is cooperative.      UC Treatments / Results  Labs (all labs ordered are listed, but only abnormal results are displayed) Labs Reviewed  CYTOLOGY, (ORAL, ANAL, URETHRAL) ANCILLARY ONLY    EKG   Radiology No results found.  Procedures Procedures (including critical care time)  Medications Ordered in UC Medications - No data to display  Initial Impression / Assessment and Plan / UC Course  I have reviewed the triage vital signs and the nursing notes.  Pertinent labs & imaging results that were available during my care of the patient were reviewed by me and considered in my medical decision making (see chart for details).   Patient is well-appearing, normotensive, afebrile, not tachycardic, not tachypneic, oxygenating well on room air.    1. Screening for STD (sexually transmitted disease) Self swab performed to check for gonorrhea, chlamydia, and trichomonas Treat as indicated Patient denies HIV/RPR testing today Discussed swab test is gold standard per CDC and urine testing is not routinely performed for STD screening in urgent care setting Encouraged  condom use with every sexual encounter moving forward   The patient was given the opportunity to ask questions.  All questions answered to their satisfaction.  The patient is in agreement to this plan.    Final Clinical Impressions(s) / UC Diagnoses   Final diagnoses:  Screening for STD (sexually transmitted disease)  Discharge Instructions      We will call you if any of the results are positive.  Please use condoms with every sexual encounter to prevent STI.     ED Prescriptions   None    PDMP not reviewed this encounter.   Eulogio Bear, NP 06/11/22 1113

## 2022-06-11 NOTE — Discharge Instructions (Addendum)
We will call you if any of the results are positive.  Please use condoms with every sexual encounter to prevent STI.

## 2022-06-12 LAB — CYTOLOGY, (ORAL, ANAL, URETHRAL) ANCILLARY ONLY
Chlamydia: NEGATIVE
Comment: NEGATIVE
Comment: NEGATIVE
Comment: NORMAL
Neisseria Gonorrhea: NEGATIVE
Trichomonas: NEGATIVE

## 2023-01-26 ENCOUNTER — Ambulatory Visit
Admission: RE | Admit: 2023-01-26 | Discharge: 2023-01-26 | Disposition: A | Discharge status: Home / Self Care | Payer: BC Managed Care – PPO | Source: Ambulatory Visit | Attending: Internal Medicine | Admitting: Internal Medicine

## 2023-01-26 ENCOUNTER — Ambulatory Visit: Payer: BC Managed Care – PPO

## 2023-01-26 VITALS — BP 154/77 | HR 73 | Temp 98.1°F | Resp 18

## 2023-01-26 DIAGNOSIS — S93402A Sprain of unspecified ligament of left ankle, initial encounter: Secondary | ICD-10-CM

## 2023-01-26 DIAGNOSIS — W19XXXA Unspecified fall, initial encounter: Secondary | ICD-10-CM | POA: Diagnosis not present

## 2023-01-26 MED ORDER — NAPROXEN 500 MG PO TABS: 500.0000 mg | ORAL_TABLET | Freq: Two times a day (BID) | ORAL | 0 refills | Status: AC

## 2023-01-26 NOTE — ED Provider Notes (Signed)
[facility]    CSN: [id number] Arrival date & time: 01/26/23  3086      History   Chief Complaint Chief Complaint  Patient presents with   Ankle Pain    Entered by patient    HPI Tyler Solomon is a 27 y.o. male.   Tyler Solomon is a 27 y.o. male presenting for chief complaint of left ankle pain that started last night as a result of injury.  Patient was running while playing with his 76-year-old son when he accidentally stepped in a pothole with his left foot causing him to twist his ankle and fall down.  He heard a pop during the injury but was able to stand up and bear weight on the left ankle after injury despite pain.  He has walked with a limp favoring the left ankle ever since.  No numbness or tingling distally to injury.  Pain is most significant to the medial aspect of the left ankle.  No abrasions or lacerations.  Denies previous injury to the left ankle.  He has been taking ibuprofen 800 mg every 8 hours for pain with some relief.   Ankle Pain   Past Medical History:  Diagnosis Date   Hypertension     Patient Active Problem List   Diagnosis Date Noted   Tobacco use disorder 11/20/2021   HTN (hypertension) 11/20/2021   Leukocytosis 11/20/2021   Appendicitis 11/19/2021    Past Surgical History:  Procedure Laterality Date   CERVICAL FUSION     LAPAROSCOPIC APPENDECTOMY N/A 11/20/2021   Procedure: APPENDECTOMY LAPAROSCOPIC;  Surgeon: Lewie Chamber, DO;  Location: [facility];  Service: General;  Laterality: N/A;   WISDOM TOOTH EXTRACTION         Home Medications    Prior to Admission medications   Medication Sig Start Date End Date Taking? Authorizing Provider  naproxen (NAPROSYN) 500 MG tablet Take 1 tablet (500 mg total) by mouth 2 (two) times daily. 01/26/23  Yes Ranell Finelli, Donavan Burnet, FNP    Family History Family History  Problem Relation Age of Onset   Healthy Mother    Healthy Father     Social History Social History    Tobacco Use   Smoking status: Every Day    Types: Cigars   Smokeless tobacco: Never  Substance Use Topics   Alcohol use: Yes    Comment: occ   Drug use: No     Allergies   Bee venom   Review of Systems Review of Systems Per HPI  Physical Exam Triage Vital Signs ED Triage Vitals  Encounter Vitals Group     BP 01/26/23 0923 (!) 154/77     Systolic BP Percentile --      Diastolic BP Percentile --      Pulse Rate 01/26/23 0923 73     Resp 01/26/23 0923 18     Temp 01/26/23 0923 98.1 F (36.7 C)     Temp Source 01/26/23 0923 Oral     SpO2 01/26/23 0923 99 %     Weight --      Height --      Head Circumference --      Peak Flow --      Pain Score 01/26/23 0924 7     Pain Loc --      Pain Education --      Exclude from Growth Chart --    No data found.  Updated Vital Signs BP (!) 154/77 (BP Location: Right Arm)  Pulse 73   Temp 98.1 F (36.7 C) (Oral)   Resp 18   SpO2 99%   Visual Acuity Right Eye Distance:   Left Eye Distance:   Bilateral Distance:    Right Eye Near:   Left Eye Near:    Bilateral Near:     Physical Exam Vitals and nursing note reviewed.  Constitutional:      Appearance: He is not ill-appearing or toxic-appearing.  HENT:     Head: Normocephalic and atraumatic.     Right Ear: Hearing and external ear normal.     Left Ear: Hearing and external ear normal.     Nose: Nose normal.     Mouth/Throat:     Lips: Pink.  Eyes:     General: Lids are normal. Vision grossly intact. Gaze aligned appropriately.     Extraocular Movements: Extraocular movements intact.     Conjunctiva/sclera: Conjunctivae normal.  Pulmonary:     Effort: Pulmonary effort is normal.  Musculoskeletal:     Cervical back: Neck supple.     Right ankle: Normal.     Left ankle: No swelling, deformity, ecchymosis or lacerations. Tenderness present over the medial malleolus. Normal range of motion. Anterior drawer test negative. Normal pulse.     Left Achilles  Tendon: Normal.     Comments: Tender palpation over the diffuse posterior lateral malleolus.  No swelling, erythema, overlying rash, laceration, abrasion.  Normal range of motion.  Drinking sensation intact distally to injury.  Cap refill is less than 2.  Skin:    General: Skin is warm and dry.     Capillary Refill: Capillary refill takes less than 2 seconds.     Findings: No rash.  Neurological:     General: No focal deficit present.     Mental Status: He is alert and oriented to person, place, and time. Mental status is at baseline.     Cranial Nerves: No dysarthria or facial asymmetry.  Psychiatric:        Mood and Affect: Mood normal.        Speech: Speech normal.        Behavior: Behavior normal.        Thought Content: Thought content normal.        Judgment: Judgment normal.      UC Treatments / Results  Labs (all labs ordered are listed, but only abnormal results are displayed) Labs Reviewed - No data to display  EKG   Radiology DG Ankle Complete Left  Result Date: 01/26/2023 CLINICAL DATA:  Stepped in hole.  LEFT ankle pain EXAM: LEFT ANKLE COMPLETE - 3+ VIEW COMPARISON:  None Available. FINDINGS: Ankle mortise intact. The talar dome is normal. No malleolar fracture. The calcaneus is normal. IMPRESSION: No fracture or dislocation. Electronically Signed   By: Genevive Bi M.D.   On: 01/26/2023 09:36    Procedures Procedures (including critical care time)  Medications Ordered in UC Medications - No data to display  Initial Impression / Assessment and Plan / UC Course  I have reviewed the triage vital signs and the nursing notes.  Pertinent labs & imaging results that were available during my care of the patient were reviewed by me and considered in my medical decision making (see chart for details).   1.  Sprain of left ankle Imaging is negative for acute fracture or dislocation. We will manage this with conservative treatment as an acute sprain to the left  ankle.  RICE advised.   CAM  Walker boot applied in clinic and patient may use this as needed for compression and stability.  May use naproxen as needed for pain and swelling at home.  Walking referral to orthopedics given should symptoms fail to improve in the next few weeks with conservative care.   Counseled patient on potential for adverse effects with medications prescribed/recommended today, strict ER and return-to-clinic precautions discussed, patient verbalized understanding.    Final Clinical Impressions(s) / UC Diagnoses   Final diagnoses:  Sprain of left ankle, unspecified ligament, initial encounter     Discharge Instructions      Your x-rays of your ankle were negative for fracture or dislocation. You likely sprained your ankle.   Wear the ankle brace we provided in the clinic for the next couple of weeks to provide compression, stability, and comfort.  Please rest, ice, and elevate your ankle to help it heal and decrease inflammation.   Take 600mg  ibuprofen and/or 1,000mg  tylenol every 6 hours as needed for pain and inflammation. Take with food to avoid stomach upset.  Call the orthopedic provider listed on your discharge paperwork to schedule a follow-up appointment if your symptoms do not improve in the next 1-2 weeks with supportive care.  Return to urgent care if you experience worsening pain, numbness, tingling, change of color in your skin near the injury, or any other concerning symptoms.  I hope you feel better!     ED Prescriptions     Medication Sig Dispense Auth. Provider   naproxen (NAPROSYN) 500 MG tablet Take 1 tablet (500 mg total) by mouth 2 (two) times daily. 30 tablet Carlisle Beers, FNP      PDMP not reviewed this encounter.   Reita May [place], [place] 01/26/23 [phone]

## 2023-01-26 NOTE — Discharge Instructions (Addendum)
Your x-rays of your ankle were negative for fracture or dislocation. You likely sprained your ankle.   Wear the ankle brace we provided in the clinic for the next couple of weeks to provide compression, stability, and comfort.  Please rest, ice, and elevate your ankle to help it heal and decrease inflammation.   Take 600mg ibuprofen and/or 1,000mg tylenol every 6 hours as needed for pain and inflammation. Take with food to avoid stomach upset.  Call the orthopedic provider listed on your discharge paperwork to schedule a follow-up appointment if your symptoms do not improve in the next 1-2 weeks with supportive care.  Return to urgent care if you experience worsening pain, numbness, tingling, change of color in your skin near the injury, or any other concerning symptoms.  I hope you feel better! 

## 2023-01-26 NOTE — ED Triage Notes (Signed)
Left ankle pain.  Stepped in a hole yesterday and felt ankle pop.

## 2023-08-29 ENCOUNTER — Encounter (HOSPITAL_COMMUNITY): Payer: Self-pay

## 2023-08-29 ENCOUNTER — Emergency Department (HOSPITAL_COMMUNITY)

## 2023-08-29 ENCOUNTER — Other Ambulatory Visit: Payer: Self-pay

## 2023-08-29 ENCOUNTER — Emergency Department (HOSPITAL_COMMUNITY)
Admission: EM | Admit: 2023-08-29 | Discharge: 2023-08-29 | Disposition: A | Discharge status: Home / Self Care | Attending: Emergency Medicine | Admitting: Emergency Medicine

## 2023-08-29 DIAGNOSIS — R519 Headache, unspecified: Secondary | ICD-10-CM | POA: Insufficient documentation

## 2023-08-29 DIAGNOSIS — M549 Dorsalgia, unspecified: Secondary | ICD-10-CM | POA: Insufficient documentation

## 2023-08-29 DIAGNOSIS — R0781 Pleurodynia: Secondary | ICD-10-CM | POA: Insufficient documentation

## 2023-08-29 DIAGNOSIS — S20212A Contusion of left front wall of thorax, initial encounter: Secondary | ICD-10-CM

## 2023-08-29 DIAGNOSIS — M542 Cervicalgia: Secondary | ICD-10-CM | POA: Diagnosis not present

## 2023-08-29 DIAGNOSIS — Y92410 Unspecified street and highway as the place of occurrence of the external cause: Secondary | ICD-10-CM | POA: Insufficient documentation

## 2023-08-29 MED ORDER — IBUPROFEN 400 MG PO TABS
600.0000 mg | ORAL_TABLET | Freq: Once | ORAL | Status: AC
  Administered 2023-08-29: 600 mg via ORAL
  Filled 2023-08-29: qty 2
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
## 2023-12-29 ENCOUNTER — Ambulatory Visit
Admission: EM | Admit: 2023-12-29 | Discharge: 2023-12-29 | Disposition: A | Discharge status: Home / Self Care | Attending: Family Medicine | Admitting: Family Medicine

## 2023-12-29 DIAGNOSIS — Z8739 Personal history of other diseases of the musculoskeletal system and connective tissue: Secondary | ICD-10-CM

## 2023-12-29 DIAGNOSIS — M79674 Pain in right toe(s): Secondary | ICD-10-CM

## 2023-12-29 MED ORDER — DEXAMETHASONE SODIUM PHOSPHATE 10 MG/ML IJ SOLN
10.0000 mg | Freq: Once | INTRAMUSCULAR | Status: AC
  Administered 2023-12-29: 10 mg via INTRAMUSCULAR

## 2023-12-29 NOTE — ED Triage Notes (Signed)
 Pt is having swelling and pain in right big toe x 2 days. Taking ibuprofen .   Pt has a hx of gout.

## 2023-12-30 NOTE — ED Provider Notes (Signed)
 [facility]    CSN: [id number] Arrival date & time: 12/29/23  1830      History   Chief Complaint Chief Complaint  Patient presents with   Toe Pain    HPI Tyler Solomon is a 28 y.o. male.   Patient presenting today with 2-day history of right great toe pain, redness, swelling that started about 24 hours ago.  Denies injury to the area, fever, chills, numbness, tingling, weakness.  States history of gout that has felt the same.  Trying ibuprofen  with minimal relief.  Notes he ran out of his tart cherry supplement about 2 weeks ago and has been eating a lot of red meat lately and thinks this may have flared it up.    Past Medical History:  Diagnosis Date   Hypertension     Patient Active Problem List   Diagnosis Date Noted   Tobacco use disorder 11/20/2021   HTN (hypertension) 11/20/2021   Leukocytosis 11/20/2021   Appendicitis 11/19/2021    Past Surgical History:  Procedure Laterality Date   CERVICAL FUSION     LAPAROSCOPIC APPENDECTOMY N/A 11/20/2021   Procedure: APPENDECTOMY LAPAROSCOPIC;  Surgeon: Evonnie Dorothyann LABOR, DO;  Location: [facility];  Service: General;  Laterality: N/A;   WISDOM TOOTH EXTRACTION         Home Medications    Prior to Admission medications   Medication Sig Start Date End Date Taking? Authorizing Provider  naproxen  (NAPROSYN ) 500 MG tablet Take 1 tablet (500 mg total) by mouth 2 (two) times daily. 01/26/23   Enedelia Dorna HERO, FNP    Family History Family History  Problem Relation Age of Onset   Healthy Mother    Healthy Father     Social History Social History   Tobacco Use   Smoking status: Every Day    Types: Cigars   Smokeless tobacco: Never  Vaping Use   Vaping status: Never Used  Substance Use Topics   Alcohol use: Yes    Comment: occ   Drug use: No     Allergies   Bee venom   Review of Systems Review of Systems PER HPI  Physical Exam Triage Vital Signs ED Triage Vitals  Encounter  Vitals Group     BP 12/29/23 1920 (!) 147/72     Girls Systolic BP Percentile --      Girls Diastolic BP Percentile --      Boys Systolic BP Percentile --      Boys Diastolic BP Percentile --      Pulse Rate 12/29/23 1920 60     Resp 12/29/23 1920 16     Temp 12/29/23 1920 98.8 F (37.1 C)     Temp Source 12/29/23 1920 Oral     SpO2 12/29/23 1920 98 %     Weight --      Height --      Head Circumference --      Peak Flow --      Pain Score 12/29/23 1924 10     Pain Loc --      Pain Education --      Exclude from Growth Chart --    No data found.  Updated Vital Signs BP (!) 147/72 (BP Location: Right Arm)   Pulse 60   Temp 98.8 F (37.1 C) (Oral)   Resp 16   SpO2 98%   Visual Acuity Right Eye Distance:   Left Eye Distance:   Bilateral Distance:    Right Eye Near:  Left Eye Near:    Bilateral Near:     Physical Exam Vitals and nursing note reviewed.  Constitutional:      Appearance: Normal appearance.  HENT:     Head: Atraumatic.  Eyes:     Extraocular Movements: Extraocular movements intact.     Conjunctiva/sclera: Conjunctivae normal.  Cardiovascular:     Rate and Rhythm: Normal rate and regular rhythm.  Pulmonary:     Effort: Pulmonary effort is normal.     Breath sounds: Normal breath sounds.  Musculoskeletal:        General: Swelling and tenderness present. No signs of injury. Normal range of motion.     Cervical back: Normal range of motion and neck supple.  Skin:    General: Skin is warm and dry.     Findings: No bruising or erythema.  Neurological:     Mental Status: He is oriented to person, place, and time.     Motor: No weakness.     Gait: Gait normal.     Comments: Right lower extremity neurovascularly intact  Psychiatric:        Mood and Affect: Mood normal.        Thought Content: Thought content normal.        Judgment: Judgment normal.    UC Treatments / Results  Labs (all labs ordered are listed, but only abnormal results are  displayed) Labs Reviewed - No data to display  EKG   Radiology No results found.  Procedures Procedures (including critical care time)  Medications Ordered in UC Medications  dexamethasone  (DECADRON ) injection 10 mg (10 mg Intramuscular Given 12/29/23 1958)    Initial Impression / Assessment and Plan / UC Course  I have reviewed the triage vital signs and the nursing notes.  Pertinent labs & imaging results that were available during my care of the patient were reviewed by me and considered in my medical decision making (see chart for details).     Possibly gout flare, will treat with IM Decadron , tart cherry, supportive over-the-counter medications and home care.  Return for worsening symptoms.  Final Clinical Impressions(s) / UC Diagnoses   Final diagnoses:  Pain of right great toe  History of gout   Discharge Instructions   None    ED Prescriptions   None    PDMP not reviewed this encounter.   Stuart Vernell Norris, [place] 12/30/23 1709
